# Patient Record
Sex: Female | Born: 1945 | Race: White | Hispanic: No | Marital: Married | Smoking: Never smoker
Health system: Southern US, Community
[De-identification: ages and names within clinical notes are randomized; demographics above are authoritative.]

## PROBLEM LIST (undated history)

## (undated) DIAGNOSIS — I10 Essential (primary) hypertension: Secondary | ICD-10-CM

## (undated) DIAGNOSIS — M1990 Unspecified osteoarthritis, unspecified site: Secondary | ICD-10-CM

## (undated) DIAGNOSIS — R600 Localized edema: Secondary | ICD-10-CM

## (undated) HISTORY — PX: TUBAL LIGATION: SHX77

## (undated) HISTORY — PX: APPENDECTOMY: SHX54

## (undated) HISTORY — DX: Unspecified osteoarthritis, unspecified site: M19.90

## (undated) HISTORY — PX: PARATHYROIDECTOMY: SHX19

## (undated) HISTORY — PX: OTHER SURGICAL HISTORY: SHX169

## (undated) HISTORY — PX: RETINAL LASER PROCEDURE: SHX2339

## (undated) HISTORY — PX: EYE SURGERY: SHX253

---

## 2000-08-13 ENCOUNTER — Ambulatory Visit (HOSPITAL_COMMUNITY): Admission: RE | Admit: 2000-08-13 | Discharge: 2000-08-13 | Payer: Self-pay | Admitting: Unknown Physician Specialty

## 2000-08-13 ENCOUNTER — Encounter: Payer: Self-pay | Admitting: Unknown Physician Specialty

## 2002-04-12 ENCOUNTER — Encounter: Payer: Self-pay | Admitting: Emergency Medicine

## 2002-04-12 ENCOUNTER — Emergency Department (HOSPITAL_COMMUNITY): Admission: EM | Admit: 2002-04-12 | Discharge: 2002-04-12 | Payer: Self-pay | Admitting: Emergency Medicine

## 2003-07-22 ENCOUNTER — Ambulatory Visit (HOSPITAL_COMMUNITY): Admission: RE | Admit: 2003-07-22 | Discharge: 2003-07-22 | Payer: Self-pay | Admitting: Family Medicine

## 2005-05-09 ENCOUNTER — Ambulatory Visit (HOSPITAL_COMMUNITY): Admission: RE | Admit: 2005-05-09 | Discharge: 2005-05-09 | Payer: Self-pay | Admitting: Internal Medicine

## 2006-08-27 ENCOUNTER — Other Ambulatory Visit: Admission: RE | Admit: 2006-08-27 | Discharge: 2006-08-27 | Payer: Self-pay | Admitting: Obstetrics and Gynecology

## 2007-08-06 ENCOUNTER — Ambulatory Visit (HOSPITAL_BASED_OUTPATIENT_CLINIC_OR_DEPARTMENT_OTHER): Admission: RE | Admit: 2007-08-06 | Discharge: 2007-08-06 | Payer: Self-pay | Admitting: Family Medicine

## 2007-08-10 ENCOUNTER — Ambulatory Visit: Payer: Self-pay | Admitting: Internal Medicine

## 2007-08-28 ENCOUNTER — Other Ambulatory Visit: Admission: RE | Admit: 2007-08-28 | Discharge: 2007-08-28 | Payer: Self-pay | Admitting: Obstetrics and Gynecology

## 2008-07-05 ENCOUNTER — Encounter: Admission: RE | Admit: 2008-07-05 | Discharge: 2008-07-05 | Payer: Self-pay | Admitting: Emergency Medicine

## 2008-12-23 ENCOUNTER — Encounter (HOSPITAL_COMMUNITY): Admission: RE | Admit: 2008-12-23 | Discharge: 2008-12-23 | Payer: Self-pay | Admitting: Emergency Medicine

## 2009-02-13 ENCOUNTER — Encounter: Admission: RE | Admit: 2009-02-13 | Discharge: 2009-02-13 | Payer: Self-pay | Admitting: Emergency Medicine

## 2010-04-14 IMAGING — CT CT ABDOMEN W/ CM
2 of 5 series · 17 of 46 positions shown, 19 images · IV contrast (CONTRAST)
Comparison: None

CT ABDOMEN

BUN and creatinine were obtained on site.  Results:  BUN 17
Creatinine
CLINICAL DATA: Left lower quadrant pain.  Fever.  Diverticulitis.

CT ABDOMEN AND PELVIS WITH CONTRAST
TECHNIQUE: Multidetector CT imaging of the abdomen and pelvis was
performed using the standard protocol following bolus
administration of intravenous contrast.
Contrast: 100 ml Fmnipaque-YKK and oral contrast

[Series 2: portal · axial · portal-venous · 0.93mm/px · z∈[+337,+742]mm · 14 of 93 slices shown, 16 images]
[im 6/93  soft-tissue]
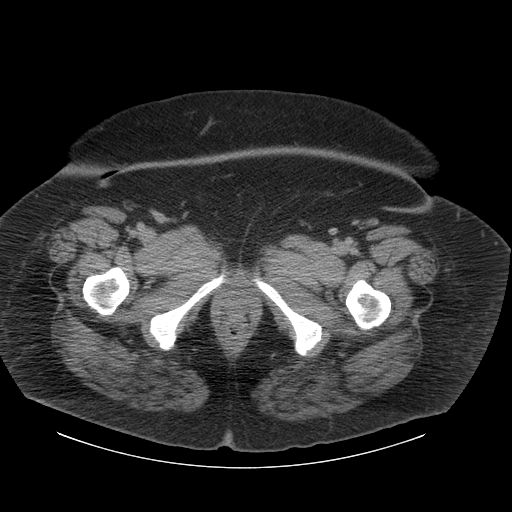
[im 6/93  bone]
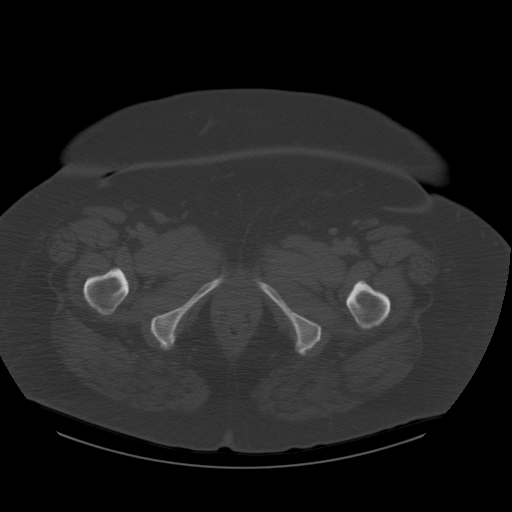
[im 11/93  soft-tissue]
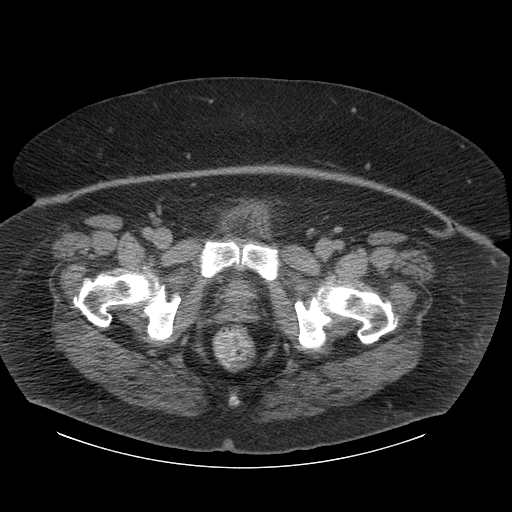
[im 21/93  soft-tissue]
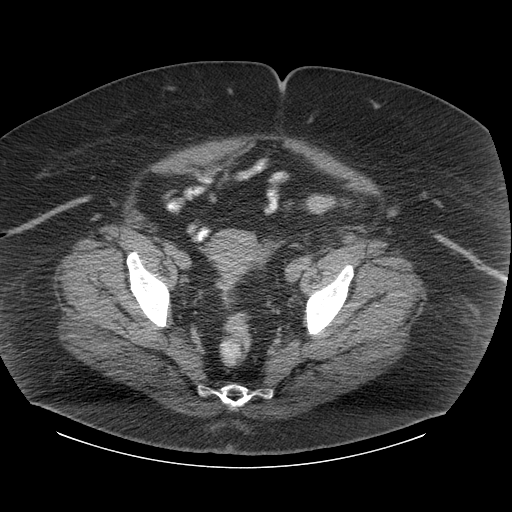
[im 26/93  soft-tissue]
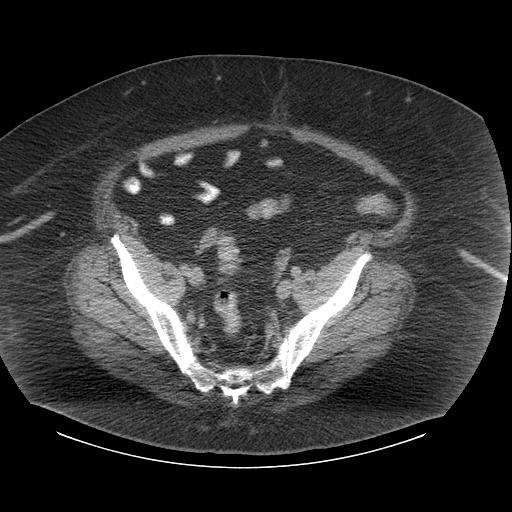
[im 31/93  soft-tissue]
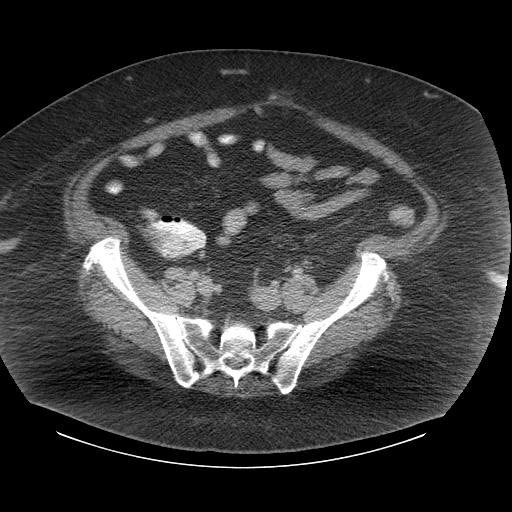
[im 36/93  soft-tissue]
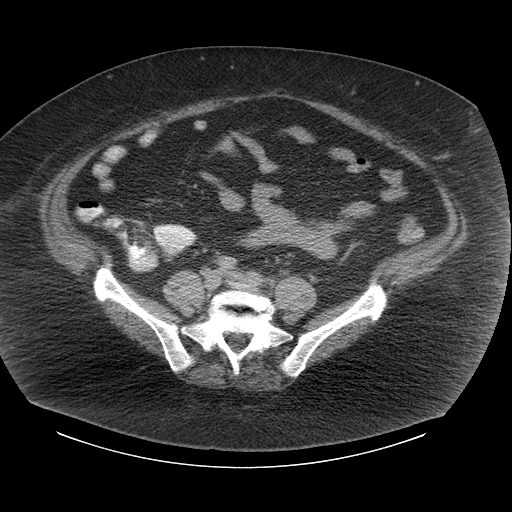
[im 41/93  soft-tissue]
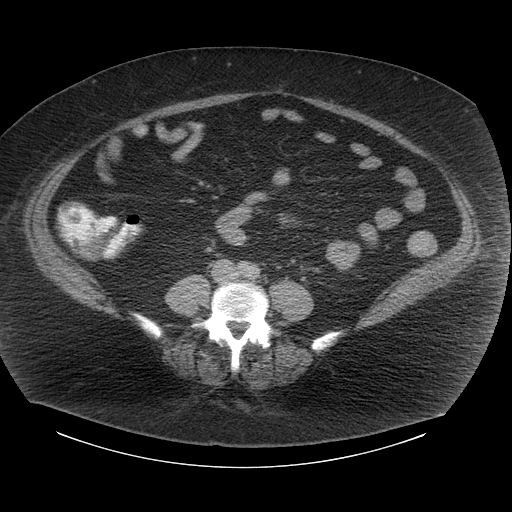
[im 52/93  soft-tissue]
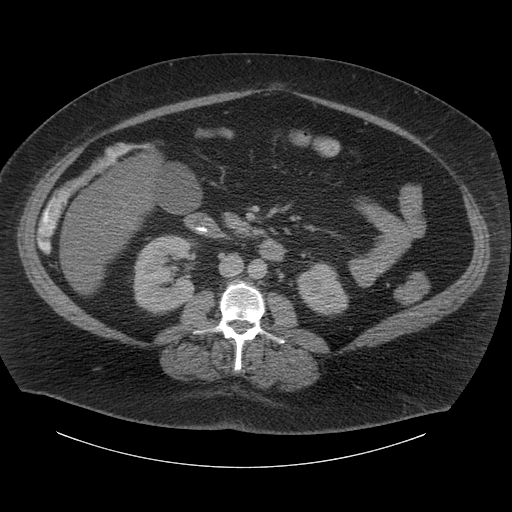
[im 57/93  soft-tissue]
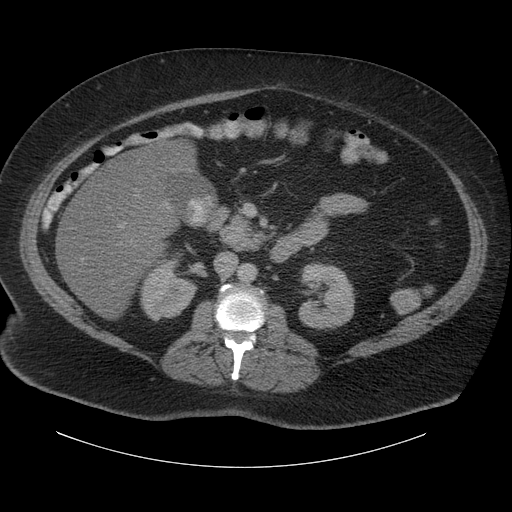
[im 57/93  bone]
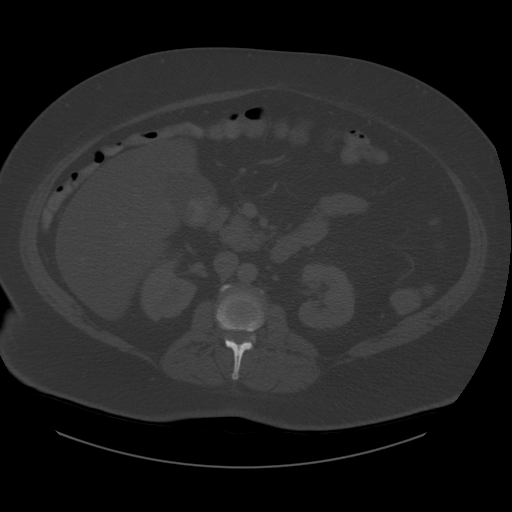
[im 62/93  soft-tissue]
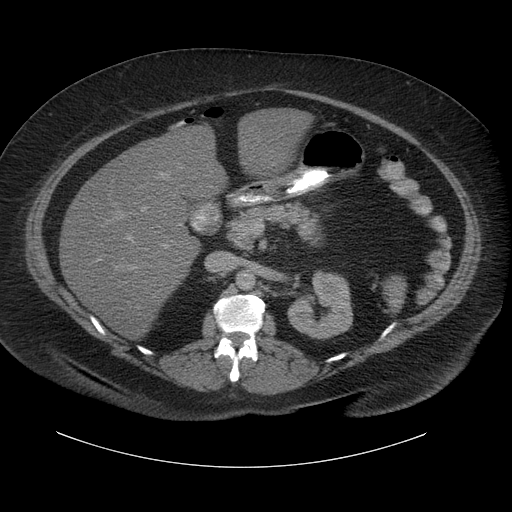
[im 67/93  soft-tissue]
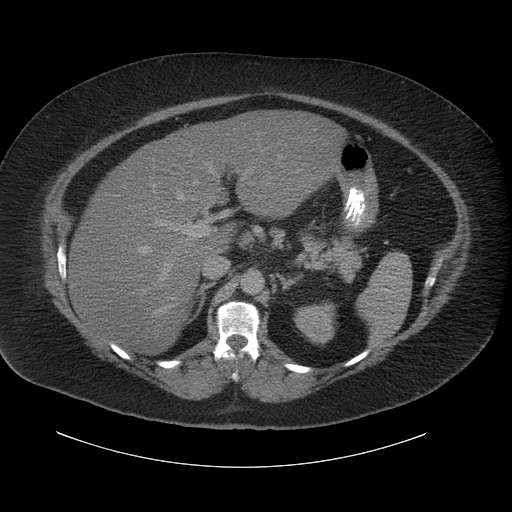
[im 72/93  soft-tissue]
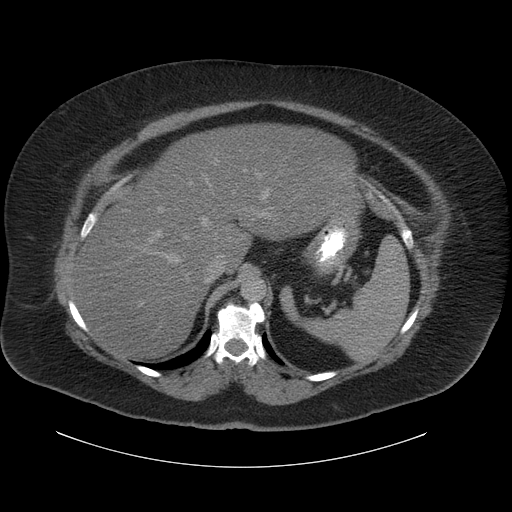
[im 82/93  soft-tissue]
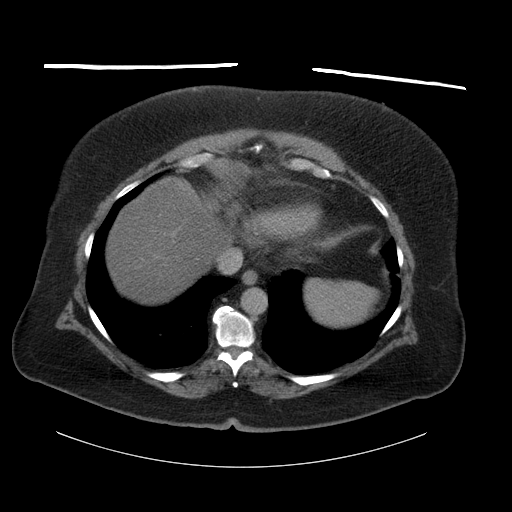
[im 87/93  soft-tissue]
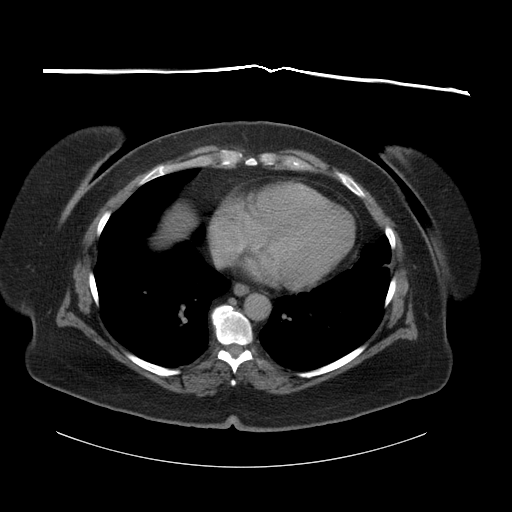

[coronals · coronal · 0.93mm/px · 3 of 95 slices shown]
[im 32/95  soft-tissue]
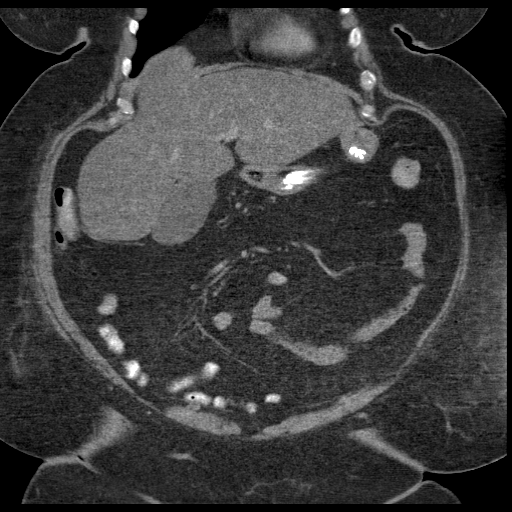
[im 42/95  soft-tissue]
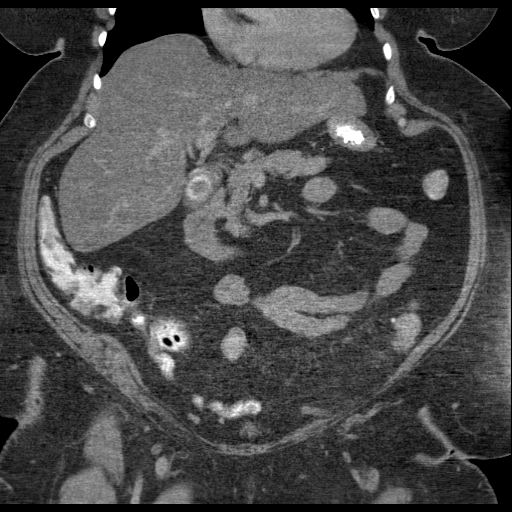
[im 53/95  soft-tissue]
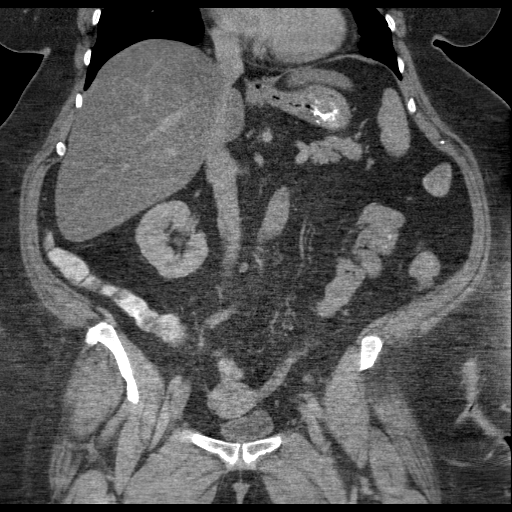

[17 of 46 positions shown; findings below may reference images not displayed]

FINDINGS: Diffuse fatty infiltration of the liver is demonstrated.
No liver masses are identified.  Multiple calcified gallstones are
seen however there is no evidence of acute cholecystitis or biliary
dilatation.

The spleen, pancreas, adrenal glands, and kidneys are normal in
appearance.  There is no evidence of soft tissue masses or
lymphadenopathy.  There is no evidence of inflammatory process or
abnormal fluid collections.
IMPRESSION: 1.  No acute intra-abdominal findings.
2.  Diffuse fatty infiltration of the liver.
3.  Cholelithiasis.

CT PELVIS
FINDINGS: Probable mild diverticulosis seen in the proximal
sigmoid colon.  However, there is no evidence of colonic wall
thickening or pericolonic inflammatory changes to suggest the
presence of acute diverticulitis.  No other inflammatory process
identified in the pelvis.

There is no evidence of pelvic soft tissue masses or
lymphadenopathy.  Uterus and adnexal regions are unremarkable
appearance.  No abnormal fluid collections are seen in the pelvis.
There is no evidence of dilated bowel loops.
IMPRESSION: Probable mild proximal sigmoid diverticulosis.  No evidence of
diverticulitis or other acute findings.

## 2010-06-27 NOTE — Procedures (Signed)
NAME:  Carol Pena, Carol Pena            ACCOUNT NO.:  [id number]   MEDICAL RECORD NO.:  [id number]          PATIENT TYPE:  OUT   LOCATION:  SLEEP CENTER                 FACILITY:  [facility]   PHYSICIAN:  Clinton D. Maple [place], MD, FCCP, FACPDATE OF BIRTH:  January 10[birth year] 1948   DATE OF STUDY:  08/06/2007                            NOCTURNAL POLYSOMNOGRAM   REFERRING PHYSICIAN:  Windle Guard, M.D.   REFERRING PHYSICIAN:  Windle Guard, M.D.   INDICATION FOR STUDY:  Hypersomnia with sleep apnea.   EPWORTH SLEEPINESS SCORE:  11/24.  BMI 51.4.  Weight 290 pounds.  Neck  17 inches.   HOME MEDICATION:  Charted and reviewed.   SLEEP ARCHITECTURE:  Split study protocol.  During the diagnostic phase  total sleep time was 122 minutes with sleep efficiency 55.6%.  Stage 1  was 29.5%.  Stage 2 70.5%.  Stage 3 and REM were absent.  Sleep latency  74.5 minutes.  Awake after sleep onset 22.5 minutes.  Arousal index  26.1.  Two Tylenol were taken at 2130 hours.   RESPIRATORY DATA:  Split study protocol.  Apnea hypopnea index (AHI) 29  per hour indicating moderate sleep apnea syndrome.  Fifty-nine events  were scored including 3 obstructive apneas and 56 hypopneas.  Events  were recorded as nonsupine.  CPAP was titrated to 10 CWP, AHI 3.9 per  hour.  She chose a petite Respironics comfort gel nasal mask with heated  humidifier.   OXYGEN DATA:  Moderate snoring before CPAP with oxygen desaturation to a  nadir of 87%.  After CPAP control mean oxygen saturation held 93.3% on  room air.   CARDIAC DATA:  Normal sinus rhythm.   MOVEMENT-PARASOMNIA:  No significant movement disturbance.  Bathroom x1.   IMPRESSIONS-RECOMMENDATIONS:  1. Moderate obstructive sleep apnea/hypopnea syndrome, AHI 29 per hour      with most events recorded while nonsupine.  Mild to moderate      snoring with oxygen desaturation to a nadir of 87%.  2. Successful CPAP titration to 10 CWP, AHI 3.9 per hour.  She chose a      petite Respironics  comfort gel nasal mask with heated humidifier.      Clinton D. Maple [place], MD, [facility], FACP  Diplomate, Biomedical engineer of Sleep Medicine  Electronically Signed     CDY/MEDQ  D:  08/09/2007 10:05:36  T:  08/09/2007 10:36:10  Job:  [id number]

## 2011-01-25 ENCOUNTER — Ambulatory Visit (HOSPITAL_COMMUNITY)
Admission: RE | Admit: 2011-01-25 | Discharge: 2011-01-25 | Disposition: A | Discharge status: Home / Self Care | Payer: 59 | Source: Ambulatory Visit | Attending: Gastroenterology | Admitting: Gastroenterology

## 2011-01-25 ENCOUNTER — Encounter (HOSPITAL_COMMUNITY): Payer: Self-pay | Admitting: *Deleted

## 2011-01-25 ENCOUNTER — Other Ambulatory Visit: Payer: Self-pay | Admitting: Gastroenterology

## 2011-01-25 ENCOUNTER — Encounter (HOSPITAL_COMMUNITY)
Admission: RE | Disposition: A | Discharge status: Home / Self Care | Payer: Self-pay | Source: Ambulatory Visit | Attending: Gastroenterology

## 2011-01-25 DIAGNOSIS — E119 Type 2 diabetes mellitus without complications: Secondary | ICD-10-CM | POA: Insufficient documentation

## 2011-01-25 DIAGNOSIS — D126 Benign neoplasm of colon, unspecified: Secondary | ICD-10-CM | POA: Insufficient documentation

## 2011-01-25 DIAGNOSIS — K5730 Diverticulosis of large intestine without perforation or abscess without bleeding: Secondary | ICD-10-CM | POA: Insufficient documentation

## 2011-01-25 DIAGNOSIS — K639 Disease of intestine, unspecified: Secondary | ICD-10-CM | POA: Insufficient documentation

## 2011-01-25 DIAGNOSIS — Z1211 Encounter for screening for malignant neoplasm of colon: Secondary | ICD-10-CM | POA: Insufficient documentation

## 2011-01-25 DIAGNOSIS — I10 Essential (primary) hypertension: Secondary | ICD-10-CM | POA: Insufficient documentation

## 2011-01-25 HISTORY — PX: COLONOSCOPY: SHX5424

## 2011-01-25 HISTORY — DX: Essential (primary) hypertension: I10

## 2011-01-25 HISTORY — DX: Localized edema: R60.0

## 2011-01-25 SURGERY — COLONOSCOPY
Anesthesia: Moderate Sedation

## 2011-01-25 MED ORDER — SODIUM CHLORIDE 0.9 % IV SOLN
INTRAVENOUS | Status: DC
  Administered 2011-01-25: 08:00:00 via INTRAVENOUS

## 2011-01-25 MED ORDER — FENTANYL CITRATE 0.05 MG/ML IJ SOLN
INTRAMUSCULAR | Status: AC
  Filled 2011-01-25: qty 2

## 2011-01-25 MED ORDER — FENTANYL CITRATE 0.05 MG/ML IJ SOLN
INTRAMUSCULAR | Status: DC | PRN
  Administered 2011-01-25 (×3): 25 ug via INTRAVENOUS

## 2011-01-25 MED ORDER — MIDAZOLAM HCL 10 MG/2ML IJ SOLN
INTRAMUSCULAR | Status: AC
  Filled 2011-01-25: qty 2

## 2011-01-25 MED ORDER — MIDAZOLAM HCL 5 MG/5ML IJ SOLN
INTRAMUSCULAR | Status: DC | PRN
  Administered 2011-01-25 (×3): 2 mg via INTRAVENOUS

## 2011-01-25 NOTE — Op Note (Signed)
[facility] [address] [place], [place]  [address]  OPERATIVE PROCEDURE REPORT  PATIENT:  Carol Pena, Carol Pena  MR#:  [id number] BIRTHDATE:  Jun 29, 1945  GENDER:  female ENDOSCOPIST:  Dr.Jyothi Anastasio Auerbach ASSISTANT:  Anthony Sar, RN and Kandice Robinsons, technician.  PROCEDURE DATE:  01/25/2011 PRE-PROCEDURE PREPERATION:  Moviprep was taken as instructed (32 ounces the night prior to the procedure and 32 ounces the morning of the procedure).  The patient was fasted for four hours prior to the procedure. PRE-PROCEDURE PHYSICAL:  Patient has stable vital signs. Neck is supple. There is no JVD, thyromegaly or LAD.Chest clear to auscultation. S1 and S2 regular. Abdomen soft, morbidly obese, non-distended, non-tender with NABS. PROCEDURE:  Colonoscopy with multiple cold biopsies and hot snare polypectomy x 2. ASA CLASS:  Class III INDICATIONS:  1) Colorectal cancer screening, average risk MEDICATIONS:  Fentanyl 75 mcg & Versed 6 mg IV.  DESCRIPTION OF PROCEDURE: After the risks, benefits, and alternatives of the procedure were thoroughly explained [including a 10% missed rate of cancer and polyps], informed consent was obtained.  Digital rectal exam was performed.  The Pentax video colonoscope [id number] was introduced through the anus and advanced to the terminal ileum which was intubated for a short distance, without limitations. The quality of the prep was good, using MoviPrep. Multiple washes were done. Small lesions could be missed. The instrument was then slowly withdrawn as the colon was fully examined. <<PROCEDUREIMAGES>>  FINDINGS:  There were scattered sigmoid divericula noted. One small sessile polyp was remved by cold biopsy from the rectosigmoid colon and one 5-6 mm sessile polyp was removed by a hot snare x 1 [150/15] from 20 cm as well. One larger polyp measuring 7-8 mm was removed from the mid-transverse colon but this was lost in stool. Nodlar mucosa was  biopsied at the cecl base to rule out carcinoidof the appendix. The rest of the colonic mucosa appeared healthy with a normal vascular pattern. No masses or AVM's were noted. The appendiceal orifice and the ICV were identified and photographed. The terminal ileum appeared normal. Retroflexed views revealed no abnormalities. The patient tolerated the procedure without immediate complications. The scope was then withdrawn from the patient and the procedure terminated.  IMPRESSION:  1) Scattered sigmoid diverticulosis. 2) Three polyps removed [see description above]. 3) Nodular changes in the cecal base-biopsied for pathology. 4) Normal terminal ileum.  RECOMMENDATIONS:  1) Await pathology results 2) Avoid all NSAIDS for the next 2 weeks. 3) Continue Surveillance 4) OP follow-up is advised on a PRN basis.  REPEAT EXAM:  In 5 years; in case the patient has any abnormal GI symptoms in the interim, she should contact the office immediately for further recommendations.  DISCHARGE INSTRUCTIONS:  Standard discharge instructions given.  ______________________________ Benjamine Mola  CPT CODES: 34742-59  DIAGNOSIS CODES:  V76.51, 562.10, 211.3  CC:  Loma Newton, MD Windle Guard, MD  n. Rosalie Doctor:   Dr.Jyothi Anastasio Auerbach at 01/25/2011 09:12 AM  [place]ray, Clerance Lav, [phone]

## 2011-01-25 NOTE — Progress Notes (Signed)
Subjective: Since I last evaluated the patient, she has had no changes in her medical history. She is here today for a screening colonoscopy.  Objective: Vital signs in last 24 hours: Temp:  [98.1 F (36.7 C)] 98.1 F (36.7 C) (12/13 0722) Resp:  [12-20] 12  (12/13 0744) BP: (129-149)/(74-119) 149/119 mmHg (12/13 0744) SpO2:  [96 %-100 %] 100 % (12/13 0744)    IGeneral appearance: alert, cooperative, appears stated age and morbidly obese Resp: clear to auscultation bilaterally Cardio: regular rate and rhythm, S1, S2 normal, no murmur, click, rub or gallop GI: soft, non-tender; bowel sounds normal; no masses,  no organomegaly  Lab Results: No results found for this basename: WBC:3,HGB:3,HCT:3,PLT:3 in the last 72 hours BMET No results found for this basename: NA:3,K:3,CL:3,CO2:3,GLUCOSE:3,BUN:3,CREATININE:3,CALCIUM:3 in the last 72 hours LFT No results found for this basename: PROT,ALBUMIN,AST,ALT,ALKPHOS,BILITOT,BILIDIR,IBILI in the last 72 hours PT/INR No results found for this basename: LABPROT:2,INR:2 in the last 72 hours Hepatitis Panel No results found for this basename: HEPBSAG,HCVAB,HEPAIGM,HEPBIGM in the last 72 hours C-Diff No results found for this basename: CDIFFTOX:3 in the last 72 hours Fecal Lactopherrin No results found for this basename: FECLLACTOFRN in the last 72 hours  Studies/Results: No results found.  Medications: I have reviewed the patient's current medications.  Assessment/Plan: Colorectal cancer screening: Proceed with a colonoscopy at this time.  Hodge Stachnik 01/25/2011, 7:44 AM

## 2011-01-26 ENCOUNTER — Encounter (HOSPITAL_COMMUNITY): Payer: Self-pay

## 2011-01-26 ENCOUNTER — Encounter (HOSPITAL_COMMUNITY): Payer: Self-pay | Admitting: Gastroenterology

## 2011-08-02 ENCOUNTER — Ambulatory Visit (INDEPENDENT_AMBULATORY_CARE_PROVIDER_SITE_OTHER): Payer: BC Managed Care – PPO | Admitting: Family Medicine

## 2011-08-02 ENCOUNTER — Ambulatory Visit: Payer: BC Managed Care – PPO

## 2011-08-02 VITALS — BP 168/84 | HR 80 | Temp 98.0°F | Resp 18 | Ht 63.0 in

## 2011-08-02 DIAGNOSIS — M79671 Pain in right foot: Secondary | ICD-10-CM

## 2011-08-02 DIAGNOSIS — M79609 Pain in unspecified limb: Secondary | ICD-10-CM

## 2011-08-02 NOTE — Patient Instructions (Addendum)
Patient information: Metatarsalgia (The Basics)View in SpanishWritten by the doctors and editors at UpToDate  What is metatarsalgia? -- Metatarsalgia is a condition that causes pain in the ball of the foot. It happens when there is inflammation in the metatarsals, which are the foot bones closest to the toes (figure 1). Different things can cause metatarsalgia. It can happen in people who run or do other activities that put a lot of pressure on the feet. It can also happen in people who wear tight-fitting shoes a lot or have certain foot problems. What are the symptoms of metatarsalgia? -- Metatarsalgia causes pain in the ball of the foot. The pain can also spread to the toes. The pain is usually worse when people run or do other activities that put pressure on their feet. Will I need tests? -- Probably not. Your doctor or nurse should be able to tell if you have metatarsalgia by learning about your symptoms and doing an exam. He or she might order an X-ray of your foot to make sure your symptoms are not caused by another condition. How is metatarsalgia treated? -- Treatment for metatarsalgia usually involves 1 or more of the following: Resting your foot - Give your foot a chance to heal by resting. But don't completely stop being active. You can do activities that put less pressure on your feet, such as swimming.  Putting ice on your foot when it hurts or after activities that cause pain - You can put a cold gel pack, bag of ice, or bag of frozen vegetables on the painful area every 1 to 2 hours, for 15 minutes each time. Put a thin towel between the ice (or other cold object) and your skin.  Taking a pain-relieving medicine, such as acetaminophen (sample brand name: Tylenol), ibuprofen (sample brand names: Advil, Motrin), or naproxen (sample brand names: Aleve, Naprosyn).  Wearing sturdy shoes and a metatarsal insert - Sneakers with a lot of cushion and good arch and heel support are best. Your  doctor will also probably recommend that you put a padded insert called a "metatarsal pad" into your shoe.  Wearing arch supports or special shoe inserts called "orthotics" that are made to fit your foot In most cases, these treatments help improve symptoms. But if your symptoms don't improve, your doctor might talk with you about other options. This might involve surgery to correct the position of your foot bones. How long does metatarsalgia take to heal? -- Metatarsalgia can take weeks to months to heal, depending on the cause and your symptoms. More on this topic

## 2011-08-02 NOTE — Progress Notes (Signed)
  Subjective:    Patient ID: Carol Pena, female    DOB: [birth year], 66 y.o.   MRN: [id number]  HPI R foot pain x 1 day.  Pt was house hunting with son and felt pop in R foot.  Has had pain in R foot since this point.  Difficulty bearing weight on R foot.  No noted swelling or paresthesia.  No direct trauma per pt.  Pain is on plantar and dorsal aspect of R foot.    Review of Systems See HPI, otherwise ROS negative     Objective:   Physical Exam Gen: in bed, NAD  CV: RRR PULM: CTAB Ankle: No visible erythema or swelling. Range of motion is full in all directions. Strength is 5/5 in all directions. Stable lateral and medial ligaments; squeeze test and kleiger test unremarkable; Talar dome nontender; + TTP over 1st metatarsal on plantar and dorsal aspect.  + pain with plantar and dorsiflexion of 1st metatarsal.  Negative tarsal tunnel tinel's Unable to bear weight on anterior ball of R foot.     [facility] reading (PRIMARY) by  Dr. Alvester Morin: No noted fracture in 1st metatarsal; Otherwise normal anatomy .      Assessment & Plan:   History and physical most consistent with metatarsalgia.  No noted fracture on xray.  Post op shoe.  Metatarsal pad.  RICE treatment.  Avoid NSAIDs in setting of HTN.  Follow up with [facility] or PCP in 1 week.  General red flags reviewed.  Handout given.     The patient and/or caregiver has been counseled thoroughly with regard to treatment plan and/or medications prescribed including dosage, schedule, interactions, rationale for use, and possible side effects and they verbalize understanding. Diagnoses and expected course of recovery discussed and will return if not improved as expected or if the condition worsens. Patient and/or caregiver verbalized understanding.

## 2011-08-06 ENCOUNTER — Telehealth: Payer: Self-pay

## 2011-08-06 NOTE — Telephone Encounter (Signed)
COPY OF PT'S XRAY AT FRONT FOR P/U.

## 2011-08-06 NOTE — Telephone Encounter (Signed)
PATIENT NEEDS TO PICK UP A COPY OF HER X-RAYS SHE HAD DONE ON HER FOOT LAST WEEK.  SHE NEEDS TO GET THIS TODAY BECAUSE SHE HAS ANOTHER APPOINTMENT TOMORROW.

## 2013-08-03 ENCOUNTER — Ambulatory Visit: Payer: BC Managed Care – PPO | Admitting: Podiatrist

## 2013-08-05 ENCOUNTER — Ambulatory Visit (INDEPENDENT_AMBULATORY_CARE_PROVIDER_SITE_OTHER): Payer: BC Managed Care – PPO

## 2013-08-05 ENCOUNTER — Other Ambulatory Visit: Payer: Self-pay | Admitting: Podiatrist

## 2013-08-05 ENCOUNTER — Encounter: Payer: Self-pay | Admitting: Podiatrist

## 2013-08-05 ENCOUNTER — Ambulatory Visit (INDEPENDENT_AMBULATORY_CARE_PROVIDER_SITE_OTHER): Payer: BC Managed Care – PPO | Admitting: Podiatrist

## 2013-08-05 VITALS — BP 126/69 | HR 100 | Resp 16

## 2013-08-05 DIAGNOSIS — M79609 Pain in unspecified limb: Secondary | ICD-10-CM

## 2013-08-05 DIAGNOSIS — M79671 Pain in right foot: Secondary | ICD-10-CM

## 2013-08-05 DIAGNOSIS — M8430XA Stress fracture, unspecified site, initial encounter for fracture: Secondary | ICD-10-CM

## 2013-08-05 DIAGNOSIS — M84375A Stress fracture, left foot, initial encounter for fracture: Secondary | ICD-10-CM

## 2013-08-05 DIAGNOSIS — M779 Enthesopathy, unspecified: Secondary | ICD-10-CM

## 2013-08-05 NOTE — Progress Notes (Signed)
  Subjective:  Patient presents for pain on the lateral aspect of her left foot.  She relates she has had gout in the past and wonders if it could be from a gout flare.  Relates she is on allopurionl and her recent uric acid level was in the 9 range.    Objective:  Patient is awake, alert, and oriented x 3.  In no acute distress.  Vascular status is intact with palpable pedal pulses at 2/4 DP and PT bilateral and capillary refill time within normal limits. Neurological sensation is also intact bilaterally via Semmes Weinstein monofilament at 3/5 sites. Light touch, vibratory sensation, Achilles tendon reflex is intact. Dermatological exam reveals skin color, turger and texture as normal. No open lesions present.  Musculature intact with dorsiflexion, plantarflexion, inversion, eversion. Supinated foot type is noted.  Pain along the lateral foot and 4th metatarsal base is noted clinically.  xrays show probably stress fracture in the 4th metatarsal near the base.  Assessment:  Stress fracture left foot  Plan;  Discussed xray findings and recommended a short air fracture walker.  Discussed gout and how it usually presents and discussed this is likely not a gout attack.  Will consider inserts in the future to support her supinated foot type.

## 2013-08-05 NOTE — Patient Instructions (Addendum)
DIABETIC SHOES-  Diabetic shoes and accomidative inserts are indicated for those persons who have Diabetes and who are at an increased for a foot ulceration.  This requires patients to have decreased feeling in the feet, circulation problems, as well as an abnormal foot structure, or a combination of these disorders.  As podiatrists, we can recommend a diabetic shoe to your primary care doctor or endocrinologist HOWEVER, it is up to them to decide if you are eligible or in need of these shoes.  Before we can take measurements or do the ordering of a diabetic shoe and accomidative insert, it is required that we get a signed authorization from your physician both confirming you have Diabetes and that you are at risk for an ulceration.  Please note, if you physician will not sign for the shoes, there is no way we can go around him or her.  You are welcome to place an order for the shoes, however the financial responsibility is up to you and your insurance cannot be billed.   Once, approval is granted for the diabetic shoes and inserts we will make you a separate appointment to be measured, casted, and for you to choose the diabetic shoe.  We will then call you when they are ready for dispensing and you will be seen to make sure the shoes and inserts fit your foot properly.  As this is a multi-step process, it generally takes 4-8 weeks from start to finish.  You cooperation is appreciated.  If it has been more than 6 weeks from the date you ordered your diabetic shoe and you have not heard from our office, please give [place] a call.      Metatarsal Stress Fracture When too much stress is put on the foot, as in running and jumping sports, the center shaft of the bones of the forefoot is very susceptible to stress fractures (break in bone). This is because of repetitive stress on the bone. This injury is more common if osteoporosis is present or if inadequate running shoes are used. Rapid increase in running  distances are often the cause. Running distances should be gradually increased to avoid this problem. Shoes should be used which adequately cushion the foot. Shoes should absorb the shocks of the activity.  DIAGNOSIS  Usually the diagnosis is made by history. The foot progressively becomes sorer with activities. X-rays may be negative (show no break) within the first 2 to 3 weeks of the beginning of pain. A later X-ray may show signs of healing bone (callus formation). A bone scan or MRI will usually make the diagnosis earlier. TREATMENT AND HOME CARE INSTRUCTIONS  Treatment may or may not include a cast, removable fracture boot, or walking shoe. Casts are used for short periods of time to prevent muscle atrophy (muscle wasting).  Activities should be stopped until further advised by your caregiver.  Wear shoes with adequate shock absorbing abilities.  Alternative exercise may be undertaken while waiting for healing. These may include bicycling and swimming, or as your caregiver suggests. If you do not have a cast or splint:  You may walk on your injured foot as tolerated or advised.  Do not put any weight on your injured foot for as long as directed by your caregiver. Slowly increase the amount of time you walk on the foot as the pain allows or as advised.  Use crutches until you can bear weight without pain. A gradual increase in weight bearing may help.  Apply ice  to the injury for 15-20 minutes each hour while awake for the first 2 days. Put the ice in a plastic bag and place a towel between the bag of ice and your skin.  Only take over-the-counter or prescription medicines for pain, discomfort, or fever as directed by your caregiver. SEEK IMMEDIATE MEDICAL CARE IF:   Pain is becoming worse rather than better, or if pain is uncontrolled with medications.  You have increased swelling or redness in the foot. MAKE SURE YOU:   Understand these instructions.  Will watch your  condition.  Will get help right away if you are not doing well or get worse. Document Released: 01/27/2000 Document Revised: 04/23/2011 Document Reviewed: 11/25/2007 [facility] Patient Information 2015 [place], [place]. This information is not intended to replace advice given to you by your health care provider. Make sure you discuss any questions you have with your health care provider.

## 2013-09-23 ENCOUNTER — Other Ambulatory Visit: Payer: Self-pay | Admitting: Dermatology

## 2013-09-29 ENCOUNTER — Ambulatory Visit (INDEPENDENT_AMBULATORY_CARE_PROVIDER_SITE_OTHER): Payer: BC Managed Care – PPO | Admitting: Certified Nurse Midwife

## 2013-09-29 ENCOUNTER — Encounter: Payer: Self-pay | Admitting: Certified Nurse Midwife

## 2013-09-29 VITALS — BP 120/64 | HR 68 | Resp 16 | Ht 62.75 in | Wt 269.0 lb

## 2013-09-29 DIAGNOSIS — Z01419 Encounter for gynecological examination (general) (routine) without abnormal findings: Secondary | ICD-10-CM

## 2013-09-29 DIAGNOSIS — Z124 Encounter for screening for malignant neoplasm of cervix: Secondary | ICD-10-CM

## 2013-09-29 DIAGNOSIS — Z0000 Encounter for general adult medical examination without abnormal findings: Secondary | ICD-10-CM

## 2013-09-29 DIAGNOSIS — I10 Essential (primary) hypertension: Secondary | ICD-10-CM

## 2013-09-29 DIAGNOSIS — E119 Type 2 diabetes mellitus without complications: Secondary | ICD-10-CM | POA: Insufficient documentation

## 2013-09-29 LAB — POCT URINALYSIS DIPSTICK
BILIRUBIN UA: NEGATIVE
GLUCOSE UA: NEGATIVE
Ketones, UA: NEGATIVE
LEUKOCYTES UA: NEGATIVE
NITRITE UA: NEGATIVE
Protein, UA: NEGATIVE
RBC UA: NEGATIVE
UROBILINOGEN UA: NEGATIVE
pH, UA: 5

## 2013-09-29 NOTE — Patient Instructions (Signed)

## 2013-09-29 NOTE — Progress Notes (Signed)
68 y.o. G45P2002 Married Caucasian Fe here for annual exam.Menopausal no HRT. Patient denies any vaginal dryness or vaginal bleeding. Patient being followed with Dr. Arelia Sneddon with Diabetes, hypertension,and gout management. Recent death of father and now mother with dementia. Patient is still dealing with this grief and does have support from friends, family and church.  Patient has been working on weight loss and now has lost another 30 pounds! No other health issues today.  Patient's last menstrual period was 02/12/2001.          Sexually active: Yes.    The current method of family planning is tubal ligation.    Exercising: No.  exercise Smoker:  no  Health Maintenance: Pap:  09-27-09 neg MMG: 09-14-13 composition category b, birads category 1:neg Colonoscopy:  Age 53 normal BMD:   2008  normal TDaP: up to date Labs: Poct urine-neg Self breast exam: done occ   reports that she has never smoked. She does not have any smokeless tobacco history on file. She reports that she does not drink alcohol or use illicit drugs.  Past Medical History  Diagnosis Date  . Hypertension   . Diabetes mellitus   . Cataract     left eye catract  . Gout   . Diverticula of colon   . Cholelithiasis   . Edema of both legs     Past Surgical History  Procedure Laterality Date  . Parathyroidectomy    . Retinal laser procedure    . Eye surgery    . C-section x2    . Catract left eye    . Appendectomy    . Colonoscopy  01/25/2011    Procedure: COLONOSCOPY;  Surgeon: Juanita Craver, MD;  Location: [facility];  Service: Endoscopy;  Laterality: N/A;  . Tubal ligation      Current Outpatient Prescriptions  Medication Sig Dispense Refill  . ALLOPURINOL PO Take 300 mg by mouth daily.       Marland Kitchen lisinopril (PRINIVIL,ZESTRIL) 10 MG tablet Take 10 mg by mouth daily.        . metFORMIN (GLUCOPHAGE) 1000 MG tablet Take 1,000 mg by mouth 2 (two) times daily with a meal.      . multivitamin (THERAGRAN) per tablet Take  1 tablet by mouth daily.        . fluticasone (CUTIVATE) 0.005 % ointment        No current facility-administered medications for this visit.    Family History  Problem Relation Age of Onset  . Osteoporosis Mother   . Heart disease Mother   . Hypertension Mother   . Heart attack Mother     ROS:  Pertinent items are noted in HPI.  Otherwise, a comprehensive ROS was negative.  Exam:   BP 120/64  Pulse 68  Resp 16  Ht 5' 2.75" (1.594 m)  Wt 269 lb (122.018 kg)  BMI 48.02 kg/m2  LMP 02/12/2001 Height: 5' 2.75" (159.4 cm)  Ht Readings from Last 3 Encounters:  09/29/13 5' 2.75" (1.594 m)  08/02/11 5\' 3"  (1.6 m)  01/25/11 5\' 3"  (1.6 m)    General appearance: alert, cooperative and appears stated age Head: Normocephalic, without obvious abnormality, atraumatic Neck: no adenopathy, supple, symmetrical, trachea midline and thyroid normal to inspection and palpation Lungs: clear to auscultation bilaterally Breasts: normal appearance, no masses or tenderness, No nipple retraction or dimpling, No nipple discharge or bleeding, No axillary or supraclavicular adenopathy Heart: regular rate and rhythm Abdomen: soft, non-tender; no masses,  no organomegaly  Extremities: extremities normal, atraumatic, no cyanosis or edema Skin: Skin color, texture, turgor normal. No rashes or lesions Lymph nodes: Cervical, supraclavicular, and axillary nodes normal. No abnormal inguinal nodes palpated Neurologic: Grossly normal   Pelvic: External genitalia:  no lesions              Urethra:  normal appearing urethra with no masses, tenderness or lesions              Bartholin's and Skene's: normal                 Vagina: normal appearing vagina with normal color and discharge, no lesions              Cervix: normal, very posterior, non tender, no lesions              Pap taken: Yes.   Bimanual Exam:  Uterus:  normal size, contour, position, consistency, mobility, non-tender and anteverted limited due  to body habitus              Adnexa: no mass, fullness, tenderness adnexa not palpated due to body habitus, no large masses noted               Rectovaginal: Confirms               Anus:  normal sphincter tone, no lesions  A:  Well Woman with normal exam  Menopausal no HRT  Limited pelvic exam due to morbid obesity  Hypertension/Type 2 Diabetes/Gout under management with PCP    P:   Reviewed health and wellness pertinent to exam  Aware of need to evaluate if vaginal bleeding  Discussed limited exam due to body habitus and PUS recommended, which she has had before due to weigh, but has been over 3 years. Patient agreeable to scheduling. Discussed will be notified by of insurance information and then scheduled.  Pap smear taken today with HPVHR  Continue follow up as indicated  counseled on breast self exam, mammography screening, adequate intake of calcium and vitamin D, diet and exercise, Kegel's exercises, discussed BMD due, patient to schedule.  return annually or prn  An After Visit Summary was printed and given to the patient.

## 2013-09-30 ENCOUNTER — Ambulatory Visit: Payer: BC Managed Care – PPO | Admitting: Certified Nurse Midwife

## 2013-09-30 NOTE — Progress Notes (Signed)
Reviewed personally.  M. Suzanne Jesse Hirst, MD.  

## 2013-10-05 ENCOUNTER — Ambulatory Visit: Payer: BC Managed Care – PPO | Admitting: Certified Nurse Midwife

## 2013-10-05 LAB — IPS PAP TEST WITH HPV

## 2013-10-06 ENCOUNTER — Telehealth: Payer: Self-pay | Admitting: Certified Nurse Midwife

## 2013-10-06 NOTE — Telephone Encounter (Signed)
Spoke with patient. Advised that per benefit quote received, she will be responsible for $15 copay when she comes in for PUS. Patient agreeable. Scheduled PUS. Advised patient of 72 hour cancellation policy and $233 cancellation fee. Patient agreeable.

## 2013-10-20 ENCOUNTER — Ambulatory Visit (INDEPENDENT_AMBULATORY_CARE_PROVIDER_SITE_OTHER): Payer: BC Managed Care – PPO | Admitting: Gynecology

## 2013-10-20 ENCOUNTER — Encounter: Payer: Self-pay | Admitting: Gynecology

## 2013-10-20 ENCOUNTER — Ambulatory Visit (INDEPENDENT_AMBULATORY_CARE_PROVIDER_SITE_OTHER): Payer: BC Managed Care – PPO

## 2013-10-20 VITALS — BP 120/60 | Resp 18 | Ht 62.75 in | Wt 275.0 lb

## 2013-10-20 DIAGNOSIS — E669 Obesity, unspecified: Secondary | ICD-10-CM

## 2013-10-20 NOTE — Progress Notes (Signed)
       Pt here for PUS due to limited pelvic exam secondary to obesity. Images reviewed with pt Normal atrophic appearing uterus, ovaries noted, no free fluid. Pt assured Questions addressed

## 2013-12-14 ENCOUNTER — Encounter: Payer: Self-pay | Admitting: Gynecology

## 2014-10-06 ENCOUNTER — Ambulatory Visit: Payer: BC Managed Care – PPO | Admitting: Certified Nurse Midwife

## 2015-03-27 ENCOUNTER — Ambulatory Visit (INDEPENDENT_AMBULATORY_CARE_PROVIDER_SITE_OTHER): Payer: Medicare Other | Admitting: Family Medicine

## 2015-03-27 VITALS — BP 138/80 | HR 88 | Temp 98.6°F | Resp 16 | Ht 63.0 in | Wt 274.0 lb

## 2015-03-27 DIAGNOSIS — E119 Type 2 diabetes mellitus without complications: Secondary | ICD-10-CM

## 2015-03-27 DIAGNOSIS — J029 Acute pharyngitis, unspecified: Secondary | ICD-10-CM

## 2015-03-27 LAB — POCT RAPID STREP A (OFFICE): RAPID STREP A SCREEN: NEGATIVE

## 2015-03-27 MED ORDER — FLUTICASONE PROPIONATE 50 MCG/ACT NA SUSP: 2.0000 | Freq: Every day | NASAL | Status: DC

## 2015-03-27 MED ORDER — MUCINEX DM 30-600 MG PO TB12: 1.0000 | ORAL_TABLET | Freq: Two times a day (BID) | ORAL | Status: DC

## 2015-03-27 NOTE — Progress Notes (Signed)
Subjective:    Patient ID: Carol Pena, female    DOB: [birth year], 70 y.o.   MRN: [id number]  03/27/2015  Sore Throat; Cough; and Nasal Congestion   HPI This 70 y.o. female presents for evaluation of sore throat. Onset five days ago.  +low grade fever 99.8.  No chills/sweats.  No body aches.  No headache.  +rhinorrhea; +nasal congestion excessive green to brown.  ST diffuse; pain with swallowing.  Tea with lemon.  +coughing mild.  No SOB.  No v/d.  Grandson sick.  Taking Vitamin C and Zinc.  Sugars running 128 yesterday.   Review of Systems  Constitutional: Positive for fever, chills, diaphoresis and fatigue.  HENT: Positive for congestion, postnasal drip, rhinorrhea and sore throat. Negative for ear pain, sinus pressure and trouble swallowing.   Respiratory: Positive for cough. Negative for shortness of breath.   Cardiovascular: Negative for chest pain, palpitations and leg swelling.  Gastrointestinal: Negative for nausea, vomiting, abdominal pain, diarrhea and constipation.    Past Medical History  Diagnosis Date  . Hypertension   . Diabetes mellitus   . Cataract     left eye catract  . Gout   . Diverticula of colon   . Cholelithiasis   . Edema of both legs   . Arthritis    Past Surgical History  Procedure Laterality Date  . Parathyroidectomy    . Retinal laser procedure    . Eye surgery    . C-section x2    . Catract left eye    . Appendectomy    . Colonoscopy  01/25/2011    Procedure: COLONOSCOPY;  Surgeon: Juanita Craver, MD;  Location: [facility];  Service: Endoscopy;  Laterality: N/A;  . Tubal ligation    . Cesarean section     Allergies  Allergen Reactions  . Flagyl [Metronidazole] Rash  . Penicillins Hives    Social History   Social History  . Marital Status: Married    Spouse Name: N/A  . Number of Children: N/A  . Years of Education: N/A   Occupational History  . Not on file.   Social History Main Topics  . Smoking status: Never Smoker     . Smokeless tobacco: Never Used  . Alcohol Use: No  . Drug Use: No  . Sexual Activity:    Partners: Male    Birth Control/ Protection: Surgical     Comment: btl   Other Topics Concern  . Not on file   Social History Narrative   Family History  Problem Relation Age of Onset  . Osteoporosis Mother   . Heart disease Mother   . Hypertension Mother   . Heart attack Mother        Objective:    BP 138/80 mmHg  Pulse 88  Temp(Src) 98.6 F (37 C) (Oral)  Resp 16  Ht 5\' 3"  (1.6 m)  Wt 274 lb (124.286 kg)  BMI 48.55 kg/m2  SpO2 96%  LMP 02/12/2001 Physical Exam  Constitutional: She is oriented to person, place, and time. She appears well-developed and well-nourished. No distress.  HENT:  Head: Normocephalic and atraumatic.  Eyes: Conjunctivae are normal. Pupils are equal, round, and reactive to light.  Neck: Normal range of motion. Neck supple.  Cardiovascular: Normal rate, regular rhythm and normal heart sounds.  Exam reveals no gallop and no friction rub.   No murmur heard. Pulmonary/Chest: Effort normal and breath sounds normal. She has no wheezes. She has no rales.  Neurological: She is alert  and oriented to person, place, and time.  Skin: She is not diaphoretic.  Psychiatric: She has a normal mood and affect. Her behavior is normal.  Nursing note and vitals reviewed.  Results for orders placed or performed in visit on 03/27/15  Culture, Group A Strep  Result Value Ref Range   Organism ID, Bacteria Normal Upper Respiratory Flora    Organism ID, Bacteria No Beta Hemolytic Streptococci Isolated   POCT rapid strep A  Result Value Ref Range   Rapid Strep A Screen Negative Negative       Assessment & Plan:   1. Sore throat   2. Type 2 diabetes mellitus without complication, without long-term current use of insulin ([place])     Orders Placed This Encounter  Procedures  . Culture, Group A Strep    Order Specific Question:  Source    Answer:  throat  . POCT rapid  strep A   Meds ordered this encounter  Medications  . metFORMIN (GLUMETZA) 1000 MG (MOD) 24 hr tablet    Sig: Take 1,000 mg by mouth daily with breakfast.  . glimepiride (AMARYL) 4 MG tablet    Sig: Take 4 mg by mouth daily with breakfast.  . losartan (COZAAR) 50 MG tablet    Sig: Take 50 mg by mouth daily.  . fluticasone (FLONASE) 50 MCG/ACT nasal spray    Sig: Place 2 sprays into both nostrils daily.    Dispense:  16 g    Refill:  6  . Dextromethorphan-Guaifenesin (MUCINEX DM) 30-600 MG TB12    Sig: Take 1 tablet by mouth 2 (two) times daily.    Dispense:  28 each    Refill:  0    No Follow-up on file.    Zarin Hagmann Elayne Guerin, M.D. [facility] [address] [place], [place]  [address] [phone] phone [phone] fax

## 2015-03-27 NOTE — Patient Instructions (Signed)

## 2015-03-29 LAB — CULTURE, GROUP A STREP: Organism ID, Bacteria: NORMAL

## 2015-11-23 ENCOUNTER — Encounter (HOSPITAL_COMMUNITY): Payer: Self-pay | Admitting: Neurology

## 2015-11-23 ENCOUNTER — Observation Stay (HOSPITAL_COMMUNITY)
Admission: EM | Admit: 2015-11-23 | Discharge: 2015-11-25 | Disposition: A | Discharge status: Home / Self Care | Payer: Medicare Other | Attending: Internal Medicine | Admitting: Internal Medicine

## 2015-11-23 ENCOUNTER — Emergency Department (HOSPITAL_COMMUNITY): Payer: Medicare Other

## 2015-11-23 DIAGNOSIS — Z7982 Long term (current) use of aspirin: Secondary | ICD-10-CM | POA: Diagnosis not present

## 2015-11-23 DIAGNOSIS — Z7951 Long term (current) use of inhaled steroids: Secondary | ICD-10-CM | POA: Insufficient documentation

## 2015-11-23 DIAGNOSIS — E119 Type 2 diabetes mellitus without complications: Secondary | ICD-10-CM

## 2015-11-23 DIAGNOSIS — Z880 Allergy status to penicillin: Secondary | ICD-10-CM

## 2015-11-23 DIAGNOSIS — M1990 Unspecified osteoarthritis, unspecified site: Secondary | ICD-10-CM | POA: Diagnosis not present

## 2015-11-23 DIAGNOSIS — R002 Palpitations: Secondary | ICD-10-CM | POA: Insufficient documentation

## 2015-11-23 DIAGNOSIS — M109 Gout, unspecified: Secondary | ICD-10-CM

## 2015-11-23 DIAGNOSIS — R079 Chest pain, unspecified: Secondary | ICD-10-CM | POA: Diagnosis present

## 2015-11-23 DIAGNOSIS — I10 Essential (primary) hypertension: Secondary | ICD-10-CM | POA: Diagnosis not present

## 2015-11-23 DIAGNOSIS — R931 Abnormal findings on diagnostic imaging of heart and coronary circulation: Secondary | ICD-10-CM

## 2015-11-23 DIAGNOSIS — R6884 Jaw pain: Secondary | ICD-10-CM | POA: Diagnosis not present

## 2015-11-23 DIAGNOSIS — R0789 Other chest pain: Secondary | ICD-10-CM | POA: Diagnosis not present

## 2015-11-23 DIAGNOSIS — Z8249 Family history of ischemic heart disease and other diseases of the circulatory system: Secondary | ICD-10-CM | POA: Insufficient documentation

## 2015-11-23 DIAGNOSIS — K5730 Diverticulosis of large intestine without perforation or abscess without bleeding: Secondary | ICD-10-CM | POA: Insufficient documentation

## 2015-11-23 DIAGNOSIS — Z888 Allergy status to other drugs, medicaments and biological substances status: Secondary | ICD-10-CM

## 2015-11-23 DIAGNOSIS — E669 Obesity, unspecified: Secondary | ICD-10-CM | POA: Diagnosis not present

## 2015-11-23 DIAGNOSIS — E785 Hyperlipidemia, unspecified: Secondary | ICD-10-CM | POA: Diagnosis not present

## 2015-11-23 DIAGNOSIS — Z6841 Body Mass Index (BMI) 40.0 and over, adult: Secondary | ICD-10-CM | POA: Insufficient documentation

## 2015-11-23 DIAGNOSIS — G4733 Obstructive sleep apnea (adult) (pediatric): Secondary | ICD-10-CM | POA: Diagnosis not present

## 2015-11-23 LAB — TROPONIN I: Troponin I: <0.03 ng/mL (ref <0.03)

## 2015-11-23 LAB — CBC
HEMATOCRIT: 35.3 % — AB (ref 36.0–46.0)
Hemoglobin: 11.3 g/dL — ABNORMAL LOW (ref 12.0–15.0)
MCH: 29.4 pg (ref 26.0–34.0)
MCHC: 32 g/dL (ref 30.0–36.0)
MCV: 91.9 fL (ref 78.0–100.0)
PLATELETS: 218 10*3/uL (ref 150–400)
RBC: 3.84 MIL/uL — ABNORMAL LOW (ref 3.87–5.11)
RDW: 14.7 % (ref 11.5–15.5)
WBC: 7.7 10*3/uL (ref 4.0–10.5)

## 2015-11-23 LAB — BASIC METABOLIC PANEL
Anion gap: 10 (ref 5–15)
BUN: 29 mg/dL — AB (ref 6–20)
CHLORIDE: 108 mmol/L (ref 101–111)
CO2: 20 mmol/L — AB (ref 22–32)
CREATININE: 1.17 mg/dL — AB (ref 0.44–1.00)
Calcium: 9.4 mg/dL (ref 8.9–10.3)
GFR calc Af Amer: 54 mL/min — ABNORMAL LOW (ref >60)
GFR calc non Af Amer: 47 mL/min — ABNORMAL LOW (ref >60)
Glucose, Bld: 151 mg/dL — ABNORMAL HIGH (ref 65–99)
POTASSIUM: 4.5 mmol/L (ref 3.5–5.1)
Sodium: 138 mmol/L (ref 135–145)

## 2015-11-23 LAB — GLUCOSE, CAPILLARY
GLUCOSE-CAPILLARY: 115 mg/dL — AB (ref 65–99)
Glucose-Capillary: 172 mg/dL — ABNORMAL HIGH (ref 65–99)
Glucose-Capillary: 163 mg/dL — ABNORMAL HIGH (ref 65–99)

## 2015-11-23 LAB — I-STAT TROPONIN, ED: Troponin i, poc: 0.01 ng/mL (ref 0.00–0.08)

## 2015-11-23 LAB — TSH: TSH: 2.524 u[IU]/mL (ref 0.350–4.500)

## 2015-11-23 MED ORDER — INSULIN ASPART 100 UNIT/ML ~~LOC~~ SOLN
0.0000 [IU] | Freq: Three times a day (TID) | SUBCUTANEOUS | Status: DC
  Administered 2015-11-24: 5 [IU] via SUBCUTANEOUS
  Administered 2015-11-25 (×2): 2 [IU] via SUBCUTANEOUS

## 2015-11-23 MED ORDER — NITROGLYCERIN 0.4 MG SL SUBL: 0.4000 mg | SUBLINGUAL_TABLET | SUBLINGUAL | Status: DC | PRN

## 2015-11-23 MED ORDER — ALLOPURINOL 300 MG PO TABS
300.0000 mg | ORAL_TABLET | Freq: Every day | ORAL | Status: DC
  Administered 2015-11-23 – 2015-11-25 (×3): 300 mg via ORAL
  Filled 2015-11-23 (×3): qty 1

## 2015-11-23 MED ORDER — ONDANSETRON HCL 4 MG/2ML IJ SOLN: 4.0000 mg | Freq: Four times a day (QID) | INTRAMUSCULAR | Status: DC | PRN

## 2015-11-23 MED ORDER — ACETAMINOPHEN 325 MG PO TABS: 650.0000 mg | ORAL_TABLET | ORAL | Status: DC | PRN

## 2015-11-23 MED ORDER — ASPIRIN EC 81 MG PO TBEC
81.0000 mg | DELAYED_RELEASE_TABLET | Freq: Every day | ORAL | Status: DC
  Administered 2015-11-24 – 2015-11-25 (×2): 81 mg via ORAL
  Filled 2015-11-23 (×2): qty 1

## 2015-11-23 MED ORDER — ENOXAPARIN SODIUM 40 MG/0.4ML ~~LOC~~ SOLN
40.0000 mg | Freq: Every day | SUBCUTANEOUS | Status: DC
  Administered 2015-11-23 – 2015-11-24 (×2): 40 mg via SUBCUTANEOUS
  Filled 2015-11-23 (×3): qty 0.4

## 2015-11-23 MED ORDER — LOSARTAN POTASSIUM 50 MG PO TABS
100.0000 mg | ORAL_TABLET | Freq: Every day | ORAL | Status: DC
  Administered 2015-11-24 – 2015-11-25 (×2): 100 mg via ORAL
  Filled 2015-11-23 (×2): qty 2

## 2015-11-23 NOTE — ED Notes (Signed)
Patient transported to X-ray 

## 2015-11-23 NOTE — Plan of Care (Signed)
Problem: Education: Goal: Knowledge of [facility] Education information/materials will improve Outcome: Progressing Discussed plans for echo and serial troponins.

## 2015-11-23 NOTE — ED Provider Notes (Signed)
[facility] DEPT Provider Note   CSN: [id number] Arrival date & time: 11/23/15  0940     History   Chief Complaint Chief Complaint  Patient presents with  . Chest Pain    HPI Carol Pena is a 70 y.o. female.  HPI Patient presents with episodes of chest pain. Is dull in her left-sided chest pain goes up to her jaw and shoulder. Comes and goes. States that she also feels her heart pounding at times. This is not associated with chest pain. Seen a primary care doctor Center for further evaluation. No known coronary artery disease. History. States that around 12 years ago she did have a stress test that was negative. She is pain-free now. No swelling in her legs. States she's been episodes where she is getting sweaty 2. States she has had some diarrhea but not associated with these episodes. States she does feel fatigued.  Past Medical History:  Diagnosis Date  . Arthritis   . Cataract    left eye catract  . Cholelithiasis   . Diabetes mellitus   . Diverticula of colon   . Edema of both legs   . Gout   . Hypertension     Patient Active Problem List   Diagnosis Date Noted  . Chest pain 11/23/2015  . Unspecified essential hypertension 09/29/2013    Class: History of  . Diabetes ([place]) 09/29/2013    Class: History of    Past Surgical History:  Procedure Laterality Date  . APPENDECTOMY    . c-section x2    . catract left eye    . CESAREAN SECTION    . COLONOSCOPY  01/25/2011   Procedure: COLONOSCOPY;  Surgeon: Juanita Craver, MD;  Location: [facility];  Service: Endoscopy;  Laterality: N/A;  . EYE SURGERY    . PARATHYROIDECTOMY    . RETINAL LASER PROCEDURE    . TUBAL LIGATION      OB History    Gravida Para Term Preterm AB Living   2 2 2     2    SAB TAB Ectopic Multiple Live Births           2       Home Medications    Prior to Admission medications   Medication Sig Start Date End Date Taking? Authorizing Provider  ALLOPURINOL PO Take 300 mg by  mouth daily.    Yes Historical Provider, MD  aspirin EC 81 MG tablet Take 81 mg by mouth daily.   Yes Historical Provider, MD  B Complex-C (SUPER B COMPLEX PO) Take 1 tablet by mouth daily.   Yes Historical Provider, MD  Biotin 5000 MCG CAPS Take 5,000 mcg by mouth daily.   Yes Historical Provider, MD  Calcium-Magnesium 333-167 MG TABS Take 1 tablet by mouth daily.   Yes Historical Provider, MD  Cholecalciferol (VITAMIN D3) 1000 units CAPS Take 1,000 Units by mouth daily.   Yes Historical Provider, MD  folic acid (FOLVITE) Q000111Q MCG tablet Take 800 mcg by mouth daily.   Yes Historical Provider, MD  glimepiride (AMARYL) 4 MG tablet Take 6 mg by mouth daily with breakfast.    Yes Historical Provider, MD  losartan (COZAAR) 100 MG tablet Take 100 mg by mouth daily.   Yes Historical Provider, MD  metFORMIN (GLUCOPHAGE) 1000 MG tablet Take 500-1,000 mg by mouth 3 (three) times daily. 1000 mg twice daily and 500 mg at lunch   Yes Historical Provider, MD  Turmeric 500 MG CAPS Take 500 mg by mouth  daily.   Yes Historical Provider, MD  Dextromethorphan-Guaifenesin (MUCINEX DM) 30-600 MG TB12 Take 1 tablet by mouth 2 (two) times daily. Patient not taking: Reported on 11/23/2015 03/27/15   Wardell Honour, MD  fluticasone [employer]) 50 MCG/ACT nasal spray Place 2 sprays into both nostrils daily. Patient not taking: Reported on 11/23/2015 03/27/15   Wardell Honour, MD  losartan (COZAAR) 50 MG tablet Take 50 mg by mouth daily.    Historical Provider, MD  metFORMIN (GLUMETZA) 1000 MG (MOD) 24 hr tablet Take 1,000 mg by mouth daily with breakfast.    Historical Provider, MD    Family History Family History  Problem Relation Age of Onset  . Osteoporosis Mother   . Heart disease Mother   . Hypertension Mother   . Heart attack Mother     Social History Social History  Substance Use Topics  . Smoking status: Never Smoker  . Smokeless tobacco: Never Used  . Alcohol use No     Allergies   Flagyl  [metronidazole] and Penicillins   Review of Systems Review of Systems  Constitutional: Positive for diaphoresis and fatigue. Negative for appetite change.  Respiratory: Negative for shortness of breath.   Cardiovascular: Positive for chest pain and palpitations.  Gastrointestinal: Positive for diarrhea. Negative for abdominal pain.  Musculoskeletal: Negative for back pain.  Skin: Negative for wound.  Neurological: Negative for headaches.     Physical Exam Updated Vital Signs BP 114/59   Pulse 96   Temp 98.5 F (36.9 C) (Oral)   Resp 21   Ht 5\' 3"  (1.6 m)   Wt 263 lb (119.3 kg)   LMP 02/12/2001   SpO2 98%   BMI 46.59 kg/m   Physical Exam  Constitutional: She appears well-developed.  HENT:  Head: Atraumatic.  Eyes: EOM are normal.  Neck: Neck supple. No JVD present.  Cardiovascular: Normal rate.   Abdominal: Soft. There is no tenderness.  Neurological: She is alert.  Skin: Skin is warm. Capillary refill takes less than 2 seconds.  Psychiatric: She has a normal mood and affect.     ED Treatments / Results  Labs (all labs ordered are listed, but only abnormal results are displayed) Labs Reviewed  BASIC METABOLIC PANEL - Abnormal; Notable for the following:       Result Value   CO2 20 (*)    Glucose, Bld 151 (*)    BUN 29 (*)    Creatinine, Ser 1.17 (*)    GFR calc non Af Amer 47 (*)    GFR calc Af Amer 54 (*)    All other components within normal limits  CBC - Abnormal; Notable for the following:    RBC 3.84 (*)    Hemoglobin 11.3 (*)    HCT 35.3 (*)    All other components within normal limits  TROPONIN I  TROPONIN I  TROPONIN I  I-STAT TROPOININ, ED    EKG  EKG Interpretation  Date/Time:  Wednesday November 23 2015 09:50:53 EDT Ventricular Rate:  87 PR Interval:    QRS Duration: 100 QT Interval:  363 QTC Calculation: 437 R Axis:   5 Text Interpretation:  Sinus rhythm Low voltage, precordial leads Consider anterior infarct Confirmed by  Alvino Chapel  MD, Ovid Curd [phone]) on 11/23/2015 10:19:36 AM Also confirmed by Alvino Chapel  MD, Kalven Ganim [phone]), editor [place], Joelene Millin [phone])  on 11/23/2015 10:21:12 AM       Radiology Dg Chest 2 View  Result Date: 11/23/2015 CLINICAL DATA:  Left-sided chest  pressure radiating to left jaw. Pain. Symptoms for 3 days. EXAM: CHEST  2 VIEW COMPARISON:  None. FINDINGS: Heart and mediastinal contours are within normal limits. Minimal lingular scarring. Lungs otherwise clear. No effusions. No acute bony abnormality. IMPRESSION: No active cardiopulmonary disease. Electronically Signed   By: Rolm Baptise M.D.   On: 11/23/2015 10:31    Procedures Procedures (including critical care time)  Medications Ordered in ED Medications  aspirin EC tablet 81 mg (not administered)  nitroGLYCERIN (NITROSTAT) SL tablet 0.4 mg (not administered)  acetaminophen (TYLENOL) tablet 650 mg (not administered)  ondansetron (ZOFRAN) injection 4 mg (not administered)  enoxaparin (LOVENOX) injection 40 mg (not administered)     Initial Impression / Assessment and Plan / ED Course  I have reviewed the triage vital signs and the nursing notes.  Pertinent labs & imaging results that were available during my care of the patient were reviewed by me and considered in my medical decision making (see chart for details).  Clinical Course    Patient with chest pain. EKG reassuring. Enzymes negative. She is somewhat high risk with her risk factors. Pain-free now. Will admit to internal medicine.  Final Clinical Impressions(s) / ED Diagnoses   Final diagnoses:  Chest pain, unspecified type    New Prescriptions New Prescriptions   No medications on file     Davonna Belling, MD 11/23/15 1211

## 2015-11-23 NOTE — ED Triage Notes (Signed)
Per ems- pt comes in c/o left jaw pain for several days and yesterday had several episodes of diaphoresis, left sided cp heaviness last night. Went to PCP sent here for blood work. EKG unremarkable, HR 105, given 650 mg aspirin at doctors office and she took 1-81mg  at home. Is a x 4. Denies any pain. Left AC 20 gauge. 149/76, 96% RA, CBG 175.

## 2015-11-23 NOTE — H&P (Signed)
Date: 11/23/2015               Patient Name:  Carol Pena MRN: [id number]  DOB: [birth year] Age / Sex: 70 y.o., female   PCP: Leonard Downing, MD         Medical Service: Internal Medicine Teaching Service         Attending Physician: Dr. Michel Bickers, MD    First Contact: Dr. Ledell Noss  Pager: [phone]  Second Contact: Dr. Maryellen Pile Pager: [phone]       After Hours (After 5p/  First Contact Pager: [phone]  weekends / holidays): Second Contact Pager: [phone]   Chief Complaint: jaw pain and chest heaviness   History of Present Illness: Ms. Carol Pena is a 70 y.o. female with a PMH of gout, diabetes mellitis, and hypertension who presents with jaw pain and chest heaviness. She states she had intermittent episodes of dull aching jaw and collar bone pain on the "Sunday which resolved. She thought this collar pain may be related to her chronic shoulder pain secondary to dislocation. This week she developed intermittent episodes of palpitations, diaphoresis, nausea, anxiety and diarrhea. Last night she had one episode of chest heaviness which was concerning and she went to her primary care provider this morning and was given ASA 650 x2 and sent to the ED when she described these symptoms. She denies any chest pain and states that these symptoms are not related to exertion. She does experience shortness of breath when climbing the stairs but states that this is normal for her. She says she's been feeling fatigued and had a lot of stress in her life recently related to the excitement of new grandchildren and a her mother being sick. He denies shortness of breath, cough, orthopnea, leg swelling or abdominal pain. In the ED she was found to be tachycardic with otherwise normal vital signs. EKG was showed sinus tachycardia and troponin 0.01.   Meds:  Prescriptions Prior to Admission  Medication Sig Dispense Refill Last Dose  . ALLOPURINOL PO Take 300 mg by mouth daily.     10" /11/2015 at Unknown time  . aspirin EC 81 MG tablet Take 81 mg by mouth daily.   11/22/2015 at Unknown time  . B Complex-C (SUPER B COMPLEX PO) Take 1 tablet by mouth daily.   11/22/2015 at Unknown time  . Biotin 5000 MCG CAPS Take 5,000 mcg by mouth daily.   11/22/2015 at Unknown time  . Calcium-Magnesium 333-167 MG TABS Take 1 tablet by mouth daily.   11/22/2015 at Unknown time  . Cholecalciferol (VITAMIN D3) 1000 units CAPS Take 1,000 Units by mouth daily.   11/22/2015 at Unknown time  . folic acid (FOLVITE) Q000111Q MCG tablet Take 800 mcg by mouth daily.   11/22/2015 at Unknown time  . glimepiride (AMARYL) 4 MG tablet Take 6 mg by mouth daily with breakfast.    11/23/2015 at Unknown time  . losartan (COZAAR) 100 MG tablet Take 100 mg by mouth daily.   11/23/2015 at Unknown time  . metFORMIN (GLUCOPHAGE) 1000 MG tablet Take 500-1,000 mg by mouth 3 (three) times daily. 1000 mg twice daily and 500 mg at lunch   11/22/2015 at Unknown time  . Turmeric 500 MG CAPS Take 500 mg by mouth daily.   11/22/2015 at Unknown time  . Dextromethorphan-Guaifenesin (MUCINEX DM) 30-600 MG TB12 Take 1 tablet by mouth 2 (two) times daily. (Patient not taking: Reported on 11/23/2015) 28 each 0 Not Taking  at Unknown time  . fluticasone (FLONASE) 50 MCG/ACT nasal spray Place 2 sprays into both nostrils daily. (Patient not taking: Reported on 11/23/2015) 16 g 6 Not Taking at Unknown time  . losartan (COZAAR) 50 MG tablet Take 50 mg by mouth daily.   Not Taking at Unknown time  . metFORMIN (GLUMETZA) 1000 MG (MOD) 24 hr tablet Take 1,000 mg by mouth daily with breakfast.   Not Taking at Unknown time     Allergies: Allergies as of 11/23/2015 - Review Complete 11/23/2015  Allergen Reaction Noted  . Flagyl [metronidazole] Rash 01/25/2011  . Penicillins Hives 01/22/2011   Past Medical History:  Diagnosis Date  . Arthritis   . Cataract    left eye catract  . Cholelithiasis   . Diabetes mellitus   . Diverticula of  colon   . Edema of both legs   . Gout   . Hypertension     Family History:  Mother- MI at age 67 with secondary LBBB   Social History:  She lives with her husband she is a retired Radio producer. Denies tobacco alcohol or drug use.  Review of Systems: A complete ROS was negative except as per HPI.   Physical Exam: Vitals:   11/23/15 1145 11/23/15 1215 11/23/15 1230 11/23/15 1326  BP: 114/59 115/61 108/56 107/64  Pulse: 96 94 96 92  Resp: 21  15   Temp:    98.1 F (36.7 C)  TempSrc:    Oral  SpO2: 98% 97% 97% 98%  Weight:    265 lb 11.2 oz (120.5 kg)  Height:    5\' 3"  (1.6 m)   Physical Exam  Constitutional: She is oriented to person, place, and time. She appears well-developed and well-nourished. No distress.  Eyes: EOM are normal. Pupils are equal, round, and reactive to light.  Neck: Normal range of motion. No thyromegaly present.  Cardiovascular: Normal rate and regular rhythm.   Murmur heard. Pulmonary/Chest: She has no wheezes. She has no rales.  Abdominal: Soft. There is no tenderness. There is no guarding.  Musculoskeletal: She exhibits edema. She exhibits no tenderness.  Lymphadenopathy:    She has no cervical adenopathy.  Neurological: She is alert and oriented to person, place, and time.  Psychiatric: She has a normal mood and affect. Her behavior is normal.    Labs: CBC:  Recent Labs Lab 11/23/15 0949  WBC 7.7  HGB 11.3*  HCT 35.3*  MCV 91.9  PLT 99991111   Basic Metabolic Panel:  Recent Labs Lab 11/23/15 0949  NA 138  K 4.5  CL 108  CO2 20*  GLUCOSE 151*  BUN 29*  CREATININE 1.17*  CALCIUM 9.4   Cardiac Enzymes:  Recent Labs Lab 11/23/15 1001 11/23/15 1233  TROPONINI  --  <0.03  TROPIPOC 0.01  --    EKG: EKG: sinus tachycardia, low voltage.  Imaging: Chest xray  Enlarged aorta, no acute cardiopulmonary disease   Assessment & Plan by Problem:    Chest heaviness   Ms. Carol Pena is a 70 y.o. female with PMH gout,  diabetes mellitis, and hypertension who presents with jaw pain, chest heaviness, and episode of tachycardia. In the ED EKG showed no signs of acute coronary ischemia and one negative troponin. We will continue trending her troponin. She has no history of coronary artery disease but we have ordered a lipid panel and A1c to further assess her cardiac risk factors which include gout, HTN, HLD, OSA and DM. Given the recent stressors in  her life we are considering takotsubo and have ordered ECHO to evaluate. Another consideration for these episodes of palpitations and diaphoresis is thyroid dysfunction, we have ordered TSH to evaluate.  -trending troponin x2  -follow up ECHO -NPO for now in case she requires cardiac procedure tomorrow.     Essential hypertension She is currently normotensive, we have continued her home medication losartan 100 mg daily.     Diabetes ([facility]) She is on Metfrormin 1000 mg and glimepride 6 mg daily at home. We will monitor with CBG and ISS.      Gout We have continued her home medication allopurinol 300 mg daily.   F none  E none  N NPO at midnight  DVT Ppx lovenox   Code Status FULL   Dispo: Admit patient to Observation with expected length of stay less than 2 midnights.  Signed: Ledell Noss, MD 11/23/2015, 7:01 PM  Pager: [phone]

## 2015-11-24 ENCOUNTER — Observation Stay (HOSPITAL_BASED_OUTPATIENT_CLINIC_OR_DEPARTMENT_OTHER): Payer: Medicare Other

## 2015-11-24 ENCOUNTER — Observation Stay (HOSPITAL_COMMUNITY): Payer: Medicare Other

## 2015-11-24 ENCOUNTER — Encounter (HOSPITAL_COMMUNITY): Payer: Self-pay | Admitting: Physician Assistant

## 2015-11-24 DIAGNOSIS — E785 Hyperlipidemia, unspecified: Secondary | ICD-10-CM | POA: Diagnosis not present

## 2015-11-24 DIAGNOSIS — E119 Type 2 diabetes mellitus without complications: Secondary | ICD-10-CM | POA: Diagnosis not present

## 2015-11-24 DIAGNOSIS — R0789 Other chest pain: Secondary | ICD-10-CM | POA: Diagnosis not present

## 2015-11-24 DIAGNOSIS — E7800 Pure hypercholesterolemia, unspecified: Secondary | ICD-10-CM

## 2015-11-24 DIAGNOSIS — R079 Chest pain, unspecified: Secondary | ICD-10-CM

## 2015-11-24 DIAGNOSIS — R002 Palpitations: Secondary | ICD-10-CM | POA: Diagnosis not present

## 2015-11-24 DIAGNOSIS — Z7984 Long term (current) use of oral hypoglycemic drugs: Secondary | ICD-10-CM

## 2015-11-24 DIAGNOSIS — Z79899 Other long term (current) drug therapy: Secondary | ICD-10-CM

## 2015-11-24 DIAGNOSIS — I10 Essential (primary) hypertension: Secondary | ICD-10-CM | POA: Diagnosis not present

## 2015-11-24 DIAGNOSIS — M109 Gout, unspecified: Secondary | ICD-10-CM

## 2015-11-24 DIAGNOSIS — Z8249 Family history of ischemic heart disease and other diseases of the circulatory system: Secondary | ICD-10-CM

## 2015-11-24 DIAGNOSIS — R6884 Jaw pain: Secondary | ICD-10-CM | POA: Diagnosis not present

## 2015-11-24 LAB — BASIC METABOLIC PANEL
Anion gap: 10 (ref 5–15)
BUN: 28 mg/dL — AB (ref 6–20)
CO2: 22 mmol/L (ref 22–32)
CREATININE: 1.18 mg/dL — AB (ref 0.44–1.00)
Calcium: 9.1 mg/dL (ref 8.9–10.3)
Chloride: 107 mmol/L (ref 101–111)
GFR calc Af Amer: 54 mL/min — ABNORMAL LOW (ref >60)
GFR, EST NON AFRICAN AMERICAN: 46 mL/min — AB (ref >60)
Glucose, Bld: 136 mg/dL — ABNORMAL HIGH (ref 65–99)
POTASSIUM: 4.7 mmol/L (ref 3.5–5.1)
SODIUM: 139 mmol/L (ref 135–145)

## 2015-11-24 LAB — NM MYOCAR MULTI W/SPECT W/WALL MOTION / EF
CHL CUP NUCLEAR SDS: 2
CHL CUP NUCLEAR SRS: 15
CHL CUP NUCLEAR SSS: 17
CHL CUP RESTING HR STRESS: 98 {beats}/min
Exercise duration (min): 4 min
LHR: 0.03
LV dias vol: 49 mL (ref 46–106)
LV sys vol: 22 mL
NUC STRESS TID: 1.5

## 2015-11-24 LAB — LIPID PANEL
CHOLESTEROL: 163 mg/dL (ref 0–200)
HDL: 38 mg/dL — ABNORMAL LOW (ref >40)
LDL Cholesterol: 46 mg/dL (ref 0–99)
Total CHOL/HDL Ratio: 4.3 RATIO
Triglycerides: 396 mg/dL — ABNORMAL HIGH (ref <150)
VLDL: 79 mg/dL — ABNORMAL HIGH (ref 0–40)

## 2015-11-24 LAB — HEMOGLOBIN A1C
Hgb A1c MFr Bld: 6.8 % — ABNORMAL HIGH (ref 4.8–5.6)
MEAN PLASMA GLUCOSE: 148 mg/dL

## 2015-11-24 LAB — GLUCOSE, CAPILLARY
GLUCOSE-CAPILLARY: 256 mg/dL — AB (ref 65–99)
Glucose-Capillary: 157 mg/dL — ABNORMAL HIGH (ref 65–99)
Glucose-Capillary: 156 mg/dL — ABNORMAL HIGH (ref 65–99)
Glucose-Capillary: 181 mg/dL — ABNORMAL HIGH (ref 65–99)

## 2015-11-24 LAB — TROPONIN I

## 2015-11-24 MED ORDER — SODIUM CHLORIDE 0.9 % WEIGHT BASED INFUSION
1.0000 mL/kg/h | INTRAVENOUS | Status: DC
Start: 2015-11-25 — End: 2015-11-25

## 2015-11-24 MED ORDER — SODIUM CHLORIDE 0.9% FLUSH
3.0000 mL | Freq: Two times a day (BID) | INTRAVENOUS | Status: DC
  Administered 2015-11-25: 3 mL via INTRAVENOUS

## 2015-11-24 MED ORDER — TECHNETIUM TC 99M TETROFOSMIN IV KIT
10.0000 | PACK | Freq: Once | INTRAVENOUS | Status: AC | PRN
  Administered 2015-11-24: 10 via INTRAVENOUS

## 2015-11-24 MED ORDER — MAGNESIUM OXIDE 400 (241.3 MG) MG PO TABS
400.0000 mg | ORAL_TABLET | Freq: Every day | ORAL | Status: DC
  Administered 2015-11-24 – 2015-11-25 (×2): 400 mg via ORAL
  Filled 2015-11-24 (×2): qty 1

## 2015-11-24 MED ORDER — SODIUM CHLORIDE 0.9 % WEIGHT BASED INFUSION
3.0000 mL/kg/h | INTRAVENOUS | Status: DC
  Administered 2015-11-25: 3 mL/kg/h via INTRAVENOUS

## 2015-11-24 MED ORDER — SODIUM CHLORIDE 0.9% FLUSH: 3.0000 mL | INTRAVENOUS | Status: DC | PRN

## 2015-11-24 MED ORDER — SODIUM CHLORIDE 0.9 % IV SOLN: 250.0000 mL | INTRAVENOUS | Status: DC | PRN

## 2015-11-24 MED ORDER — TECHNETIUM TC 99M TETROFOSMIN IV KIT
30.0000 | PACK | Freq: Once | INTRAVENOUS | Status: AC | PRN
  Administered 2015-11-24: 30 via INTRAVENOUS

## 2015-11-24 MED ORDER — REGADENOSON 0.4 MG/5ML IV SOLN
INTRAVENOUS | Status: AC
  Filled 2015-11-24: qty 5

## 2015-11-24 MED ORDER — ASPIRIN 81 MG PO CHEW
324.0000 mg | CHEWABLE_TABLET | ORAL | Status: AC
  Administered 2015-11-25: 324 mg via ORAL
  Filled 2015-11-24: qty 4

## 2015-11-24 MED ORDER — REGADENOSON 0.4 MG/5ML IV SOLN
0.4000 mg | Freq: Once | INTRAVENOUS | Status: AC
  Administered 2015-11-24: 0.4 mg via INTRAVENOUS
  Filled 2015-11-24: qty 5

## 2015-11-24 NOTE — Progress Notes (Signed)
Patient has expressed that she would like a social work consult so she can develop a Living Will. Put in for a social work consult. Patient has written some of her wishes down and will give to social work tomorrow.   Patient signed and understood her consent for her Cath tomorrow.   Margaret Pyle, RN

## 2015-11-24 NOTE — Progress Notes (Signed)
Tolerated Lexiscan well, final report pending  Kerin Ransom PA-C 11/24/2015 12:34 PM

## 2015-11-24 NOTE — Care Management Obs Status (Signed)
[facility] NOTIFICATION   Patient Details  Name: Lessa Cator MRN: [id number] Date of Birth: [birth year]   Medicare Observation Status Notification Given:  Yes    Bethena Roys, RN 11/24/2015, 10:28 AM

## 2015-11-24 NOTE — Progress Notes (Signed)
   Subjective:  No events overnight. Her symptoms have resolved since admission. She denies any chest heaviness or jaw pain. No nausea, vomiting or further palpitations. Tells me the most strenuous activity she does at home is taking care of her 68 year old grandson. She is able to lift him without difficulty. Denies any shortness of breath.   Objective:  Vital signs in last 24 hours: Vitals:   11/24/15 1231 11/24/15 1234 11/24/15 1235 11/24/15 1350  BP: (!) 145/89 (!) 152/72 125/73 111/76  Pulse:    (!) 104  Resp:    18  Temp:    98.5 F (36.9 C)  TempSrc:    Oral  SpO2:    99%  Weight:      Height:       Physical Exam  Constitutional: She is oriented to person, place, and time. She appears well-developed and well-nourished. No distress.  Cardiovascular: Normal rate and regular rhythm. Systolic murmur heard best LSB. No JVD.  Pulmonary/Chest: She has no wheezes. She has no rales.  Abdominal: Soft. There is no tenderness. There is no guarding.  Musculoskeletal: She exhibits edema. She exhibits no tenderness.  Neurological: She is alert and oriented to person, place, and time.  Psychiatric: She has a normal mood and affect. Her behavior is normal.   Assessment/Plan:  Chest heaviness  No further episodes of chest heaviness or jaw pain since admission. Troponin negative x3 overnight. EKG without acute ischemic changes. Evaluated by cardiology today who recommended chemical stress test today given her risk factors, family history and symptoms. Her scan was initially read as a low risk study, however, after discussing with Dr. Radford Pax there was evidence of transient ischemic dilatation with LV cavity dilation during exercise. Possibly indicating multivessel CAD with balanced ischemia. She would like her to undergo cath tomorrow for further evaluation.  -NPO at [facility] for cath  Palpitations No further palpitations. Has never had any palpations before. No arrhythmias noted on telemetry. Will  continue telemetry while here. Arrange for outpatient event monitoring at discharge.   Hyperlipidemia:  ASCVD Risk 18.5%. Would benefit from high intensity statin. Will discuss prior to discharge.   Essential hypertension She is currently normotensive, we have continued her home medication losartan 100 mg daily.   Diabetes ([place]) She is on Metfrormin 1000 mg and glimepride 6 mg daily at home. We will monitor with CBG and ISS.    Gout We have continued her home medication allopurinol 300 mg daily.   F none  E none  N NPO at midnight  DVT Ppx lovenox   Code Status FULL   Dispo: Anticipated discharge in approximately 1-2 day(s).   Maryellen Pile, MD 11/24/2015, 4:40 PM Pager: [phone]

## 2015-11-24 NOTE — Progress Notes (Signed)
Date of Admission:  11/23/2015     Principal Problem:   Chest pain, moderate coronary artery risk Active Problems:   Essential hypertension   Diabetes (HCC)   HLD (hyperlipidemia)   Gout   OSA (obstructive sleep apnea)   . regadenoson      . allopurinol  300 mg Oral Daily  . aspirin EC  81 mg Oral Daily  . enoxaparin (LOVENOX) injection  40 mg Subcutaneous Daily  . insulin aspart  0-9 Units Subcutaneous TID WC  . losartan  100 mg Oral Daily  . magnesium oxide  400 mg Oral Daily    SUBJECTIVE: Ms. Veasy is a 70 year old female who developed some aching pain in her left jaw 5 days ago. She also had some intermittent aching in her left shoulder but initially attributed that to prior shoulder separation and some chronic discomfort. She then developed some dull retrosternal pain yesterday. She also had some rapid, pounding heartbeat, sweats and felt anxious. She had one episode of nausea and vomiting and some loose stools. She has also been very tired. Her husband is currently out of town. Her daughter and daughter-in-law are currently pregnant. Her daughter-in-law has been having some problem with intermittent vaginal bleeding. Her mother has also had some health difficulties recently. She states that all of this has put her under a great deal of stress. Her mother suffered a heart attack when she was 43. Ms. Schnell became concerned that she might be having a heart attack and came to the emergency department. She has not had any further chest pain or palpitations since arrival.  Review of Systems: Review of Systems  Constitutional: Negative for chills, diaphoresis and fever.  Respiratory: Positive for shortness of breath.        Chronic, mild and unchanged dyspnea on exertion.  Cardiovascular: Positive for chest pain.  Gastrointestinal: Negative for abdominal pain and heartburn.  Psychiatric/Behavioral: The patient is nervous/anxious.     Past Medical History:  Diagnosis Date    . Arthritis   . Cataract    left eye catract  . Cholelithiasis   . Diabetes mellitus   . Diverticula of colon   . Edema of both legs   . Gout   . Hypertension     Social History  Substance Use Topics  . Smoking status: Never Smoker  . Smokeless tobacco: Never Used  . Alcohol use No    Family History  Problem Relation Age of Onset  . Osteoporosis Mother   . Heart disease Mother   . Hypertension Mother   . Heart attack Mother    Allergies  Allergen Reactions  . Flagyl [Metronidazole] Rash  . Penicillins Hives    OBJECTIVE: Vitals:   11/24/15 1231 11/24/15 1234 11/24/15 1235 11/24/15 1350  BP: (!) 145/89 (!) 152/72 125/73 111/76  Pulse:    (!) 104  Resp:    18  Temp:    98.5 F (36.9 C)  TempSrc:    Oral  SpO2:    99%  Weight:      Height:       Body mass index is 46.13 kg/m.  Physical Exam  Constitutional: She is oriented to person, place, and time.  She is sitting up in a chair visiting with her daughter. She is in good spirits.  Cardiovascular: Normal rate and regular rhythm.   No murmur heard. Pulmonary/Chest: Effort normal and breath sounds normal. She has no wheezes. She has no rales.  Abdominal: Soft. There is no  tenderness.  Neurological: She is alert and oriented to person, place, and time.  Skin: No rash noted.  Psychiatric: Mood and affect normal.    Lab Results Lab Results  Component Value Date   WBC 7.7 11/23/2015   HGB 11.3 (L) 11/23/2015   HCT 35.3 (L) 11/23/2015   MCV 91.9 11/23/2015   PLT 218 11/23/2015    Lab Results  Component Value Date   CREATININE 1.18 (H) 11/24/2015   BUN 28 (H) 11/24/2015   NA 139 11/24/2015   K 4.7 11/24/2015   CL 107 11/24/2015   CO2 22 11/24/2015   No results found for: ALT, AST, GGT, ALKPHOS, BILITOT   Troponin normal 3  EKG: Normal sinus rhythm without acute changes  Microbiology: No results found for this or any previous visit (from the past 240 hour(s)).   ASSESSMENT: She has  multiple risk factors for heart disease including strong family history of premature coronary disease, hypertension, diabetes and dyslipidemia. She has relatively sedentary. She has no evidence of an acute coronary syndrome. I appreciate cardiology's assistance and agree with Myoview stress test and echocardiogram.  PLAN: 1. Await results of stress test and echocardiogram  Michel Bickers, MD [facility] [phone] pager   [phone] cell 11/24/2015, 2:03 PM

## 2015-11-24 NOTE — Consult Note (Signed)
CARDIOLOGY CONSULT NOTE   Patient ID: Carol Pena MRN: [id number] DOB/AGE: 68/05/1945 68 y.o.  Admit date: 11/23/2015  Primary Physician   Leonard Downing, MD Primary Cardiologist   New - Dr Radford Pax Reason for Consultation   Chest pain Requesting MD: Dr Megan Salon  [id number] Carol Pena is a 70 y.o. year old female with a history of gout, DM, HTN, LE edema. Admitted 10/11 w/ CP, cards asked to see.   Pt had stress test by Dr [place] > 10 yr ago, ok. No other cardiac eval.  5 days ago, pt began having L jaw pain, aching, 2-3/10, lasting less than 5 minutes. This was not exertional. There were no aggravating or alleviating factors. It was not associated with shortness of breath, nausea, vomiting, or diaphoresis. She had several episodes, but less than 10. The symptoms concerned her.  At the same time, she began having palpitations that were associated with cold sweat. She has had 3 or 4 episodes of those. They were not at the same time as the chest pain. She had not been taking any over-the-counter cold medications and had not had any unusual amount of caffeine. After they started, she was under a little bit of extra stress because her husband was out of town. However, the palpitations had started before her husband was out of town.  She had never had either the palpitations or the chest pain before.  She is not very active, doing things around the house is her most strenuous activity. She has a knee that hurts with ambulation, especially going down stairs. She tries to avoid stairs. She admits that she does not stick tightly to a diabetic diet, but her last hemoglobin A1c was 6.7.  She occasionally keeps her grandson who is less than 31 years old, and when she lifts him or is caring for him, that is her maximum exertion. She has not had any symptoms in that setting.    Past Medical History:  Diagnosis Date  . Arthritis   . Cataract    left eye catract  . Cholelithiasis     . Diabetes mellitus   . Diverticula of colon   . Edema of both legs   . Gout   . Hypertension      Past Surgical History:  Procedure Laterality Date  . APPENDECTOMY    . c-section x2    . catract left eye    . CESAREAN SECTION    . COLONOSCOPY  01/25/2011   Procedure: COLONOSCOPY;  Surgeon: Juanita Craver, MD;  Location: [facility] ENDOSCOPY;  Service: Endoscopy;  Laterality: N/A;  . EYE SURGERY    . PARATHYROIDECTOMY    . RETINAL LASER PROCEDURE    . TUBAL LIGATION      Allergies  Allergen Reactions  . Flagyl [Metronidazole] Rash  . Penicillins Hives    I have reviewed the patient's current medications . allopurinol  300 mg Oral Daily  . aspirin EC  81 mg Oral Daily  . enoxaparin (LOVENOX) injection  40 mg Subcutaneous Daily  . insulin aspart  0-9 Units Subcutaneous TID WC  . losartan  100 mg Oral Daily     acetaminophen, nitroGLYCERIN, ondansetron (ZOFRAN) IV  Prior to Admission medications   Medication Sig Start Date End Date Taking? Authorizing Provider  ALLOPURINOL PO Take 300 mg by mouth daily.    Yes Historical Provider, MD  aspirin EC 81 MG tablet Take 81 mg by mouth daily.   Yes Historical Provider, MD  B Complex-C (SUPER B COMPLEX PO) Take 1 tablet by mouth daily.   Yes Historical Provider, MD  Biotin 5000 MCG CAPS Take 5,000 mcg by mouth daily.   Yes Historical Provider, MD  Calcium-Magnesium 333-167 MG TABS Take 1 tablet by mouth daily.   Yes Historical Provider, MD  Cholecalciferol (VITAMIN D3) 1000 units CAPS Take 1,000 Units by mouth daily.   Yes Historical Provider, MD  folic acid (FOLVITE) Q000111Q MCG tablet Take 800 mcg by mouth daily.   Yes Historical Provider, MD  glimepiride (AMARYL) 4 MG tablet Take 6 mg by mouth daily with breakfast.    Yes Historical Provider, MD  losartan (COZAAR) 100 MG tablet Take 100 mg by mouth daily.   Yes Historical Provider, MD  metFORMIN (GLUCOPHAGE) 1000 MG tablet Take 500-1,000 mg by mouth 3 (three) times daily. 1000 mg twice  daily and 500 mg at lunch   Yes Historical Provider, MD  Turmeric 500 MG CAPS Take 500 mg by mouth daily.   Yes Historical Provider, MD  Dextromethorphan-Guaifenesin (MUCINEX DM) 30-600 MG TB12 Take 1 tablet by mouth 2 (two) times daily. Patient not taking: Reported on 11/23/2015 03/27/15   Wardell Honour, MD  fluticasone [facility]) 50 MCG/ACT nasal spray Place 2 sprays into both nostrils daily. Patient not taking: Reported on 11/23/2015 03/27/15   Wardell Honour, MD  losartan (COZAAR) 50 MG tablet Take 50 mg by mouth daily.    Historical Provider, MD  metFORMIN (GLUMETZA) 1000 MG (MOD) 24 hr tablet Take 1,000 mg by mouth daily with breakfast.    Historical Provider, MD     Social History   Social History  . Marital status: Married    Spouse name: N/A  . Number of children: N/A  . Years of education: N/A   Occupational History  . Retired    Social History Main Topics  . Smoking status: Never Smoker  . Smokeless tobacco: Never Used  . Alcohol use No  . Drug use: No  . Sexual activity: Yes    Partners: Male    Birth control/ protection: Surgical     Comment: btl   Other Topics Concern  . Not on file   Social History Narrative   She lives in [place] with her husband.    Family Status  Relation Status  . Mother Deceased  . Father Deceased   Family History  Problem Relation Age of Onset  . Osteoporosis Mother   . Heart disease Mother   . Hypertension Mother   . Heart attack Mother      ROS:  Full 14 point review of systems complete and found to be negative unless listed above.  Physical Exam: Blood pressure (!) 106/56, pulse 91, temperature 98.8 F (37.1 C), resp. rate 19, height 5\' 3"  (1.6 m), weight 260 lb 6.4 oz (118.1 kg), last menstrual period 02/12/2001, SpO2 98 %.  General: Well developed, well nourished, female in no acute distress Head: Eyes PERRLA, No xanthomas.   Normocephalic and atraumatic, oropharynx without edema or exudate. Dentition:  Fair Lungs: Clear bilaterally Heart: HRRR S1 S2, no rub/gallop, no murmur. pulses are 2+ all 4 extrem.   Neck: No carotid bruits. No lymphadenopathy.  JVD not elevated Abdomen: Bowel sounds present, abdomen soft and non-tender without masses or hernias noted. Msk:  No spine or cva tenderness. No weakness, no joint deformities or effusions. Extremities: No clubbing or cyanosis. No edema.  Neuro: Alert and oriented X 3. No focal deficits noted.  Psych:  Good affect, responds appropriately Skin: No rashes or lesions noted.  Labs:   Lab Results  Component Value Date   WBC 7.7 11/23/2015   HGB 11.3 (L) 11/23/2015   HCT 35.3 (L) 11/23/2015   MCV 91.9 11/23/2015   PLT 218 11/23/2015     Recent Labs Lab 11/24/15 0311  NA 139  K 4.7  CL 107  CO2 22  BUN 28*  CREATININE 1.18*  CALCIUM 9.1  GLUCOSE 136*   No results found for: MG  Recent Labs  11/23/15 1233 11/23/15 1919 11/23/15 2315  TROPONINI <0.03 <0.03 <0.03    Recent Labs  11/23/15 1001  TROPIPOC 0.01   Lab Results  Component Value Date   CHOL 163 11/24/2015   HDL 38 (L) 11/24/2015   LDLCALC 46 11/24/2015   TRIG 396 (H) 11/24/2015   TSH  Date/Time Value Ref Range Status  11/23/2015 07:19 PM 2.524 0.350 - 4.500 uIU/mL Final    Comment:    Performed by a 3rd Generation assay with a functional sensitivity of <=0.01 uIU/mL.   Echo: Ordered  ECG:  11/23/2015 Sinus rhythm, mildly decreased voltage in precordial leads, no acute ischemic changes   Radiology:  Dg Chest 2 View Result Date: 11/23/2015 CLINICAL DATA:  Left-sided chest pressure radiating to left jaw. Pain. Symptoms for 3 days. EXAM: CHEST  2 VIEW COMPARISON:  None. FINDINGS: Heart and mediastinal contours are within normal limits. Minimal lingular scarring. Lungs otherwise clear. No effusions. No acute bony abnormality. IMPRESSION: No active cardiopulmonary disease. Electronically Signed   By: Rolm Baptise M.D.   On: 11/23/2015 10:31     ASSESSMENT AND PLAN:   The patient was seen today by Dr. Radford Pax, the patient evaluated and the data reviewed.  Principal Problem: 1.  Chest pain -Enzymes are negative for MI, ECG is not acute, echo pending - She has no ongoing ischemic symptoms, but her activity level is poor baseline. - Because her knee problems, she does not wish she would be able to do a treadmill - At this time and do not believe cardiac catheterization is indicated - Mild discuss with M.D., consider Lexi scan Myoview, it would be a 2 day study  2.   Palpitations - never had before.  - only ST seen on telemetry - that was likely in the setting of leg cramps which pt had several times last pm. - she normally takes Mg qhs, will reorder - MD advise on event monitor.  Otherwise, per IM Active Problems:   Essential hypertension   Diabetes ([place])   HLD (hyperlipidemia)   Gout   OSA (obstructive sleep apnea)   Signed: Rosaria Ferries, PA-C 11/24/2015 8:17 AM Beeper [id number]  Co-Sign MD

## 2015-11-24 NOTE — Progress Notes (Signed)
I have reviewed the images from the nuclear stress test.  The test was read as low risk with no ischemia and septal infarct with dysynchrony of the septum.  This test is actually high risk as there is evidence of transient ischemic dilatation (TID 1.5) where the LV cavity dilates during exercise.  This can be seen with multivessel CAD and she could have balanced ischemia making the scan look normal as far as perfusion defects go.  Patient will be made NPO for cath in am.

## 2015-11-25 ENCOUNTER — Encounter (HOSPITAL_COMMUNITY): Payer: Self-pay | Admitting: Cardiology

## 2015-11-25 ENCOUNTER — Other Ambulatory Visit: Payer: Self-pay | Admitting: Physician Assistant

## 2015-11-25 ENCOUNTER — Encounter (HOSPITAL_COMMUNITY)
Admission: EM | Disposition: A | Discharge status: Home / Self Care | Payer: Self-pay | Source: Home / Self Care | Attending: Emergency Medicine

## 2015-11-25 ENCOUNTER — Observation Stay (HOSPITAL_BASED_OUTPATIENT_CLINIC_OR_DEPARTMENT_OTHER): Payer: Medicare Other

## 2015-11-25 DIAGNOSIS — R002 Palpitations: Secondary | ICD-10-CM

## 2015-11-25 DIAGNOSIS — R079 Chest pain, unspecified: Secondary | ICD-10-CM

## 2015-11-25 DIAGNOSIS — E119 Type 2 diabetes mellitus without complications: Secondary | ICD-10-CM | POA: Diagnosis not present

## 2015-11-25 DIAGNOSIS — R6884 Jaw pain: Secondary | ICD-10-CM | POA: Diagnosis not present

## 2015-11-25 DIAGNOSIS — R0789 Other chest pain: Secondary | ICD-10-CM | POA: Diagnosis not present

## 2015-11-25 DIAGNOSIS — I10 Essential (primary) hypertension: Secondary | ICD-10-CM | POA: Diagnosis not present

## 2015-11-25 DIAGNOSIS — E7800 Pure hypercholesterolemia, unspecified: Secondary | ICD-10-CM | POA: Diagnosis not present

## 2015-11-25 DIAGNOSIS — R931 Abnormal findings on diagnostic imaging of heart and coronary circulation: Secondary | ICD-10-CM

## 2015-11-25 HISTORY — PX: CARDIAC CATHETERIZATION: SHX172

## 2015-11-25 LAB — BASIC METABOLIC PANEL
ANION GAP: 9 (ref 5–15)
BUN: 20 mg/dL (ref 6–20)
CALCIUM: 8.9 mg/dL (ref 8.9–10.3)
CO2: 24 mmol/L (ref 22–32)
Chloride: 101 mmol/L (ref 101–111)
Creatinine, Ser: 1.18 mg/dL — ABNORMAL HIGH (ref 0.44–1.00)
GFR, EST AFRICAN AMERICAN: 54 mL/min — AB (ref >60)
GFR, EST NON AFRICAN AMERICAN: 46 mL/min — AB (ref >60)
Glucose, Bld: 166 mg/dL — ABNORMAL HIGH (ref 65–99)
POTASSIUM: 4.1 mmol/L (ref 3.5–5.1)
SODIUM: 134 mmol/L — AB (ref 135–145)

## 2015-11-25 LAB — PROTIME-INR
INR: 1.02
PROTHROMBIN TIME: 13.4 s (ref 11.4–15.2)

## 2015-11-25 LAB — GLUCOSE, CAPILLARY
GLUCOSE-CAPILLARY: 157 mg/dL — AB (ref 65–99)
Glucose-Capillary: 184 mg/dL — ABNORMAL HIGH (ref 65–99)
Glucose-Capillary: 97 mg/dL (ref 65–99)

## 2015-11-25 LAB — ECHOCARDIOGRAM COMPLETE
HEIGHTINCHES: 63 in
WEIGHTICAEL: 4184 [oz_av]

## 2015-11-25 SURGERY — LEFT HEART CATH AND CORONARY ANGIOGRAPHY

## 2015-11-25 MED ORDER — HEPARIN (PORCINE) IN NACL 2-0.9 UNIT/ML-% IJ SOLN
INTRAMUSCULAR | Status: DC | PRN
  Administered 2015-11-25: 500 mL

## 2015-11-25 MED ORDER — SODIUM CHLORIDE 0.9 % IV SOLN: 250.0000 mL | INTRAVENOUS | Status: DC | PRN

## 2015-11-25 MED ORDER — FENTANYL CITRATE (PF) 100 MCG/2ML IJ SOLN
INTRAMUSCULAR | Status: AC
  Filled 2015-11-25: qty 2

## 2015-11-25 MED ORDER — SODIUM CHLORIDE 0.9% FLUSH: 3.0000 mL | Freq: Two times a day (BID) | INTRAVENOUS | Status: DC

## 2015-11-25 MED ORDER — HEPARIN SODIUM (PORCINE) 1000 UNIT/ML IJ SOLN
INTRAMUSCULAR | Status: DC | PRN
  Administered 2015-11-25: 5500 [IU] via INTRAVENOUS

## 2015-11-25 MED ORDER — IOPAMIDOL (ISOVUE-370) INJECTION 76%
INTRAVENOUS | Status: DC | PRN
  Administered 2015-11-25: 70 mL via INTRA_ARTERIAL

## 2015-11-25 MED ORDER — LIDOCAINE HCL (PF) 1 % IJ SOLN
INTRAMUSCULAR | Status: AC
  Filled 2015-11-25: qty 30

## 2015-11-25 MED ORDER — MIDAZOLAM HCL 2 MG/2ML IJ SOLN
INTRAMUSCULAR | Status: DC | PRN
  Administered 2015-11-25: 1 mg via INTRAVENOUS

## 2015-11-25 MED ORDER — HEPARIN SODIUM (PORCINE) 1000 UNIT/ML IJ SOLN
INTRAMUSCULAR | Status: AC
  Filled 2015-11-25: qty 1

## 2015-11-25 MED ORDER — ATORVASTATIN CALCIUM 40 MG PO TABS: 40.0000 mg | ORAL_TABLET | Freq: Every day | ORAL | 0 refills | Status: DC

## 2015-11-25 MED ORDER — VERAPAMIL HCL 2.5 MG/ML IV SOLN
INTRAVENOUS | Status: AC
  Filled 2015-11-25: qty 2

## 2015-11-25 MED ORDER — SODIUM CHLORIDE 0.9 % IV SOLN
INTRAVENOUS | Status: DC | PRN
  Administered 2015-11-25: 1 mL/kg/h via INTRAVENOUS

## 2015-11-25 MED ORDER — IOPAMIDOL (ISOVUE-370) INJECTION 76%
INTRAVENOUS | Status: AC
  Filled 2015-11-25: qty 100

## 2015-11-25 MED ORDER — FENTANYL CITRATE (PF) 100 MCG/2ML IJ SOLN
INTRAMUSCULAR | Status: DC | PRN
  Administered 2015-11-25: 25 ug via INTRAVENOUS

## 2015-11-25 MED ORDER — LIDOCAINE HCL (PF) 1 % IJ SOLN
INTRAMUSCULAR | Status: DC | PRN
  Administered 2015-11-25: 2 mL

## 2015-11-25 MED ORDER — HEPARIN (PORCINE) IN NACL 2-0.9 UNIT/ML-% IJ SOLN
INTRAMUSCULAR | Status: AC
  Filled 2015-11-25: qty 500

## 2015-11-25 MED ORDER — ATORVASTATIN CALCIUM 40 MG PO TABS: 40.0000 mg | ORAL_TABLET | Freq: Every day | ORAL | Status: DC

## 2015-11-25 MED ORDER — VERAPAMIL HCL 2.5 MG/ML IV SOLN
INTRAVENOUS | Status: DC | PRN
  Administered 2015-11-25: 10 mL via INTRA_ARTERIAL

## 2015-11-25 MED ORDER — SODIUM CHLORIDE 0.9 % WEIGHT BASED INFUSION: 1.0000 mL/kg/h | INTRAVENOUS | Status: DC

## 2015-11-25 MED ORDER — SODIUM CHLORIDE 0.9% FLUSH: 3.0000 mL | INTRAVENOUS | Status: DC | PRN

## 2015-11-25 MED ORDER — MIDAZOLAM HCL 2 MG/2ML IJ SOLN
INTRAMUSCULAR | Status: AC
  Filled 2015-11-25: qty 2

## 2015-11-25 SURGICAL SUPPLY — 12 items
CATH 5FR JL3.5 JR4 ANG PIG MP (CATHETERS) ×1 IMPLANT
DEVICE RAD COMP TR BAND LRG (VASCULAR PRODUCTS) ×1 IMPLANT
GLIDESHEATH INTRODUCER 6F 10CM (SHEATH) ×1 IMPLANT
KIT HEART LEFT (KITS) ×2 IMPLANT
NDL PERC 21GX4CM (NEEDLE) IMPLANT
NEEDLE PERC 21GX4CM (NEEDLE) ×2 IMPLANT
PACK CARDIAC CATHETERIZATION (CUSTOM PROCEDURE TRAY) ×2 IMPLANT
SYR MEDRAD MARK V 150ML (SYRINGE) ×2 IMPLANT
TRANSDUCER W/STOPCOCK (MISCELLANEOUS) ×2 IMPLANT
TUBING CIL FLEX 10 FLL-RA (TUBING) ×2 IMPLANT
WIRE HI TORQ VERSACORE-J 145CM (WIRE) ×1 IMPLANT
WIRE SAFE-T 1.5MM-J .035X260CM (WIRE) ×1 IMPLANT

## 2015-11-25 NOTE — Clinical Social Work Note (Signed)
CSW acknowledges consult for advanced directives. Will have RN put in a consult for the chaplain.  CSW signing off. Consult again if any social work needs arise.  Dayton Scrape, Louise

## 2015-11-25 NOTE — Progress Notes (Signed)
SUBJECTIVE:  No complaints this am  OBJECTIVE:   Vitals:   Vitals:   11/24/15 1235 11/24/15 1350 11/24/15 2217 11/25/15 0559  BP: 125/73 111/76 (!) 115/49 (!) 127/49  Pulse:  (!) 104 94 90  Resp:  18    Temp:  98.5 F (36.9 C) 98.5 F (36.9 C) 98.2 F (36.8 C)  TempSrc:  Oral Oral Oral  SpO2:  99% 99% 99%  Weight:    261 lb 8 oz (118.6 kg)  Height:       I&O's:   Intake/Output Summary (Last 24 hours) at 11/25/15 [id number] Last data filed at 11/25/15 [id number]  Gross per 24 hour  Intake                0 ml  Output                0 ml  Net                0 ml   TELEMETRY: Reviewed telemetry pt in NSR:     PHYSICAL EXAM General: Well developed, well nourished, in no acute distress Head: Eyes PERRLA, No xanthomas.   Normal cephalic and atramatic  Lungs:   Clear bilaterally to auscultation and percussion. Heart:   HRRR S1 S2 Pulses are 2+ & equal.. Abdomen: Bowel sounds are positive, abdomen soft and non-tender without masses Msk:  Back normal, normal gait. Normal strength and tone for age. Extremities:   No clubbing, cyanosis or edema.  DP +1 Neuro: Alert and oriented X 3. Psych:  Good affect, responds appropriately   LABS: Basic Metabolic Panel:  Recent Labs  11/24/15 0311 11/25/15 0253  NA 139 134*  K 4.7 4.1  CL 107 101  CO2 22 24  GLUCOSE 136* 166*  BUN 28* 20  CREATININE 1.18* 1.18*  CALCIUM 9.1 8.9   Liver Function Tests: No results for input(s): AST, ALT, ALKPHOS, BILITOT, PROT, ALBUMIN in the last 72 hours. No results for input(s): LIPASE, AMYLASE in the last 72 hours. CBC:  Recent Labs  11/23/15 0949  WBC 7.7  HGB 11.3*  HCT 35.3*  MCV 91.9  PLT 218   Cardiac Enzymes:  Recent Labs  11/23/15 1233 11/23/15 1919 11/23/15 2315  TROPONINI <0.03 <0.03 <0.03   BNP: Invalid input(s): POCBNP D-Dimer: No results for input(s): DDIMER in the last 72 hours. Hemoglobin A1C:  Recent Labs  11/23/15 0949  HGBA1C 6.8*   Fasting Lipid  Panel:  Recent Labs  11/24/15 0311  CHOL 163  HDL 38*  LDLCALC 46  TRIG 396*  CHOLHDL 4.3   Thyroid Function Tests:  Recent Labs  11/23/15 1919  TSH 2.524   Anemia Panel: No results for input(s): VITAMINB12, FOLATE, FERRITIN, TIBC, IRON, RETICCTPCT in the last 72 hours. Coag Panel:   Lab Results  Component Value Date   INR 1.02 11/25/2015    RADIOLOGY: Dg Chest 2 View  Result Date: 11/23/2015 CLINICAL DATA:  Left-sided chest pressure radiating to left jaw. Pain. Symptoms for 3 days. EXAM: CHEST  2 VIEW COMPARISON:  None. FINDINGS: Heart and mediastinal contours are within normal limits. Minimal lingular scarring. Lungs otherwise clear. No effusions. No acute bony abnormality. IMPRESSION: No active cardiopulmonary disease. Electronically Signed   By: Rolm Baptise M.D.   On: 11/23/2015 10:31   Nm Myocar Multi W/spect W/wall Motion / Ef  Result Date: 11/24/2015  There was no ST segment deviation noted during stress.  No T wave inversion was noted during  stress.  Defect 1: There is a medium defect of moderate severity present in the basal inferoseptal and mid inferoseptal location.  Findings consistent with prior myocardial infarction.  This is a low risk study. There was no significant ischemia identified.  Nuclear stress EF: 55%. There is base to mid septal wall dyssynchrony  Candee Furbish, MD   ASSESSMENT AND PLAN:    Principal Problem: 1.  Chest pain - Enzymes are negative for MI, ECG is not acute, echo pending - She has no ongoing ischemic symptoms, but her activity level is poor baseline. - I have reviewed the images from the nuclear stress test.  The test was read as low risk with no ischemia and septal infarct with dysynchrony of the septum.  This test is actually high risk as there is evidence of transient ischemic dilatation (TID 1.5) where the LV cavity dilates during exercise.  This can be seen with multivessel CAD and she could have balanced ischemia making the  scan look normal as far as perfusion defects go.  Will plan left heart cath today to define coronary anatomy.  Cardiac catheterization was discussed with the patient fully. The patient understands that risks include but are not limited to stroke (1 in 1000), death (1 in 31), kidney failure [usually temporary] (1 in 500), bleeding (1 in 200), allergic reaction [possibly serious] (1 in 200).  The patient understands and is willing to proceed.    2.   Palpitations - never had before.  - only ST seen on telemetry - Will get outpt 30 day heart monitor  3.  HTN  - BP controlled on current meds.  - Continue ARB  4.  Hyperlipidemia - Not on meds.  If she has CAD will need to be placed on statin.    5.  DM  - metformin on hold for cath    Fransico Him, MD  11/25/2015  8:07 AM

## 2015-11-25 NOTE — Care Management Note (Signed)
Case Management Note  Patient Details  Name: Carol Pena MRN: [id number] Date of Birth: 1945/07/19  Subjective/Objective:  Pt presented for chest pain.  Enzymes are negative for MI. Plan for cardiac cath.               Action/Plan: No needs identified at time of discussion for patient. CM will continue to monitor.   Expected Discharge Date:                  Expected Discharge Plan:  Home/Self Care  In-House Referral:  NA  Discharge planning Services  CM Consult  Post Acute Care Choice:  NA Choice offered to:  NA  DME Arranged:  N/A DME Agency:  NA  HH Arranged:  NA HH Agency:  NA  Status of Service:  Completed, signed off  If discussed at [place] of Stay Meetings, dates discussed:    Additional Comments:  Bethena Roys, RN 11/25/2015, 10:17 AM

## 2015-11-25 NOTE — Progress Notes (Signed)
  Echocardiogram 2D Echocardiogram has been performed.  Carol Pena 11/25/2015, 10:49 AM

## 2015-11-25 NOTE — Progress Notes (Signed)
Responded to page to assist patient with AD.  During visit patient was interested in Durable POA. AD form was left with pt. Chaplain available as needed.

## 2015-11-25 NOTE — H&P (View-Only) (Signed)
SUBJECTIVE:  No complaints this am  OBJECTIVE:   Vitals:   Vitals:   11/24/15 1235 11/24/15 1350 11/24/15 2217 11/25/15 0559  BP: 125/73 111/76 (!) 115/49 (!) 127/49  Pulse:  (!) 104 94 90  Resp:  18    Temp:  98.5 F (36.9 C) 98.5 F (36.9 C) 98.2 F (36.8 C)  TempSrc:  Oral Oral Oral  SpO2:  99% 99% 99%  Weight:    261 lb 8 oz (118.6 kg)  Height:       I&O's:   Intake/Output Summary (Last 24 hours) at 11/25/15 [id number] Last data filed at 11/25/15 [id number]  Gross per 24 hour  Intake                0 ml  Output                0 ml  Net                0 ml   TELEMETRY: Reviewed telemetry pt in NSR:     PHYSICAL EXAM General: Well developed, well nourished, in no acute distress Head: Eyes PERRLA, No xanthomas.   Normal cephalic and atramatic  Lungs:   Clear bilaterally to auscultation and percussion. Heart:   HRRR S1 S2 Pulses are 2+ & equal.. Abdomen: Bowel sounds are positive, abdomen soft and non-tender without masses Msk:  Back normal, normal gait. Normal strength and tone for age. Extremities:   No clubbing, cyanosis or edema.  DP +1 Neuro: Alert and oriented X 3. Psych:  Good affect, responds appropriately   LABS: Basic Metabolic Panel:  Recent Labs  11/24/15 0311 11/25/15 0253  NA 139 134*  K 4.7 4.1  CL 107 101  CO2 22 24  GLUCOSE 136* 166*  BUN 28* 20  CREATININE 1.18* 1.18*  CALCIUM 9.1 8.9   Liver Function Tests: No results for input(s): AST, ALT, ALKPHOS, BILITOT, PROT, ALBUMIN in the last 72 hours. No results for input(s): LIPASE, AMYLASE in the last 72 hours. CBC:  Recent Labs  11/23/15 0949  WBC 7.7  HGB 11.3*  HCT 35.3*  MCV 91.9  PLT 218   Cardiac Enzymes:  Recent Labs  11/23/15 1233 11/23/15 1919 11/23/15 2315  TROPONINI <0.03 <0.03 <0.03   BNP: Invalid input(s): POCBNP D-Dimer: No results for input(s): DDIMER in the last 72 hours. Hemoglobin A1C:  Recent Labs  11/23/15 0949  HGBA1C 6.8*   Fasting Lipid  Panel:  Recent Labs  11/24/15 0311  CHOL 163  HDL 38*  LDLCALC 46  TRIG 396*  CHOLHDL 4.3   Thyroid Function Tests:  Recent Labs  11/23/15 1919  TSH 2.524   Anemia Panel: No results for input(s): VITAMINB12, FOLATE, FERRITIN, TIBC, IRON, RETICCTPCT in the last 72 hours. Coag Panel:   Lab Results  Component Value Date   INR 1.02 11/25/2015    RADIOLOGY: Dg Chest 2 View  Result Date: 11/23/2015 CLINICAL DATA:  Left-sided chest pressure radiating to left jaw. Pain. Symptoms for 3 days. EXAM: CHEST  2 VIEW COMPARISON:  None. FINDINGS: Heart and mediastinal contours are within normal limits. Minimal lingular scarring. Lungs otherwise clear. No effusions. No acute bony abnormality. IMPRESSION: No active cardiopulmonary disease. Electronically Signed   By: Rolm Baptise M.D.   On: 11/23/2015 10:31   Nm Myocar Multi W/spect W/wall Motion / Ef  Result Date: 11/24/2015  There was no ST segment deviation noted during stress.  No T wave inversion was noted during  stress.  Defect 1: There is a medium defect of moderate severity present in the basal inferoseptal and mid inferoseptal location.  Findings consistent with prior myocardial infarction.  This is a low risk study. There was no significant ischemia identified.  Nuclear stress EF: 55%. There is base to mid septal wall dyssynchrony  Candee Furbish, MD   ASSESSMENT AND PLAN:    Principal Problem: 1.  Chest pain - Enzymes are negative for MI, ECG is not acute, echo pending - She has no ongoing ischemic symptoms, but her activity level is poor baseline. - I have reviewed the images from the nuclear stress test.  The test was read as low risk with no ischemia and septal infarct with dysynchrony of the septum.  This test is actually high risk as there is evidence of transient ischemic dilatation (TID 1.5) where the LV cavity dilates during exercise.  This can be seen with multivessel CAD and she could have balanced ischemia making the  scan look normal as far as perfusion defects go.  Will plan left heart cath today to define coronary anatomy.  Cardiac catheterization was discussed with the patient fully. The patient understands that risks include but are not limited to stroke (1 in 1000), death (1 in 56), kidney failure [usually temporary] (1 in 500), bleeding (1 in 200), allergic reaction [possibly serious] (1 in 200).  The patient understands and is willing to proceed.    2.   Palpitations - never had before.  - only ST seen on telemetry - Will get outpt 30 day heart monitor  3.  HTN  - BP controlled on current meds.  - Continue ARB  4.  Hyperlipidemia - Not on meds.  If she has CAD will need to be placed on statin.    5.  DM  - metformin on hold for cath    Fransico Him, MD  11/25/2015  8:07 AM

## 2015-11-25 NOTE — Progress Notes (Signed)
   Date of Admission:  11/23/2015            She has not had any further chest pain since admission. Her cardiac catheterization did not show any abnormalities today. Because of her chest pain is unclear but certainly does not appear to to cardiac sources. I agree with discharge home today and outpatient follow-up.  Michel Bickers, MD [facility] [place] [facility] [phone] pager   [phone] cell 02/15/2015, 1:32 PM

## 2015-11-25 NOTE — Discharge Instructions (Signed)
Radial Site Care °Refer to this sheet in the next few weeks. These instructions provide you with information about caring for yourself after your procedure. Your health care provider may also give you more specific instructions. Your treatment has been planned according to current medical practices, but problems sometimes occur. Call your health care provider if you have any problems or questions after your procedure. °WHAT TO EXPECT AFTER THE PROCEDURE °After your procedure, it is typical to have the following: °· Bruising at the radial site that usually fades within 1-2 weeks. °· Blood collecting in the tissue (hematoma) that may be painful to the touch. It should usually decrease in size and tenderness within 1-2 weeks. °HOME CARE INSTRUCTIONS °· Take medicines only as directed by your health care provider. °· You may shower 24-48 hours after the procedure or as directed by your health care provider. Remove the bandage (dressing) and gently wash the site with plain soap and water. Pat the area dry with a clean towel. Do not rub the site, because this may cause bleeding. °· Do not take baths, swim, or use a hot tub until your health care provider approves. °· Check your insertion site every day for redness, swelling, or drainage. °· Do not apply powder or lotion to the site. °· Do not flex or bend the affected arm for 24 hours or as directed by your health care provider. °· Do not push or pull heavy objects with the affected arm for 24 hours or as directed by your health care provider. °· Do not lift over 10 lb (4.5 kg) for 5 days after your procedure or as directed by your health care provider. °· Ask your health care provider when it is okay to: °¨ Return to work or school. °¨ Resume usual physical activities or sports. °¨ Resume sexual activity. °· Do not drive home if you are discharged the same day as the procedure. Have someone else drive you. °· You may drive 24 hours after the procedure unless otherwise  instructed by your health care provider. °· Do not operate machinery or power tools for 24 hours after the procedure. °· If your procedure was done as an outpatient procedure, which means that you went home the same day as your procedure, a responsible adult should be with you for the first 24 hours after you arrive home. °· Keep all follow-up visits as directed by your health care provider. This is important. °SEEK MEDICAL CARE IF: °· You have a fever. °· You have chills. °· You have increased bleeding from the radial site. Hold pressure on the site. °SEEK IMMEDIATE MEDICAL CARE IF: °· You have unusual pain at the radial site. °· You have redness, warmth, or swelling at the radial site. °· You have drainage (other than a small amount of blood on the dressing) from the radial site. °· The radial site is bleeding, and the bleeding does not stop after 30 minutes of holding steady pressure on the site. °· Your arm or hand becomes pale, cool, tingly, or numb. °  °This information is not intended to replace advice given to you by your health care provider. Make sure you discuss any questions you have with your health care provider. °  °Document Released: 03/03/2010 Document Revised: 02/19/2014 Document Reviewed: 08/17/2013 °Elsevier Interactive Patient Education ©2016 [employer] ° °

## 2015-11-25 NOTE — Research (Signed)
CADLAD Informed Consent   Subject Name: Carol Pena  Subject met inclusion and exclusion criteria.  The informed consent form, study requirements and expectations were reviewed with the subject and questions and concerns were addressed prior to the signing of the consent form.  The subject verbalized understanding of the trail requirements.  The subject agreed to participate in the CADLAD trial and signed the informed consent.  The informed consent was obtained prior to performance of any protocol-specific procedures for the subject.  A copy of the signed informed consent was given to the subject and a copy was placed in the subject's medical record.  Dick Jr, Ernest Henry 11/25/2015, 11:15 am  

## 2015-11-25 NOTE — Interval H&P Note (Signed)
History and Physical Interval Note:  11/25/2015 2:05 PM  Carol Pena  has presented today for surgery, with the diagnosis of abnormal nuc  The various methods of treatment have been discussed with the patient and family. After consideration of risks, benefits and other options for treatment, the patient has consented to  Procedure(s): Left Heart Cath and Coronary Angiography (N/A) as a surgical intervention .  The patient's history has been reviewed, patient examined, no change in status, stable for surgery.  I have reviewed the patient's chart and labs.  Questions were answered to the patient's satisfaction.   Cath Lab Visit (complete for each Cath Lab visit)  Clinical Evaluation Leading to the Procedure:   ACS: Yes.    Non-ACS:    Anginal Classification: CCS III  Anti-ischemic medical therapy: Minimal Therapy (1 class of medications)  Non-Invasive Test Results: High-risk stress test findings: cardiac mortality >3%/year  Prior CABG: No previous CABG        Carol Pena [facility] 11/25/2015 2:05 PM

## 2015-11-25 NOTE — Progress Notes (Signed)
   Subjective: Carol Pena had an abnormal nuclear stress test yesterday and has plans for cardiac cath today. Today she denies any chest pain, heaviness, jaw pain, abdominal pain. She has no new concerns or complaints and says she is ready for catheterization.  Objective:  Vital signs in last 24 hours: Vitals:   11/25/15 1410 11/25/15 1415 11/25/15 1420 11/25/15 1425  BP: 120/68 117/66 (!) 114/59 121/69  Pulse: 95 94 95 99  Resp: 18 15 16 15   Temp:      TempSrc:      SpO2: 100% 100% 100% 97%  Weight:      Height:       Physical Exam  Constitutional: She appears well-developed and well-nourished. No distress.  Cardiovascular: Regular rhythm.  Tachycardia present.   No murmur heard. Pulmonary/Chest: She has no wheezes. She has no rales.  Abdominal: Soft. She exhibits no distension. There is no tenderness.  Extremities: no calf tenderness, no peripheral edema   Medications: Infusions: . sodium chloride 1 mL/kg/hr (11/25/15 1445)   Scheduled Medications: . allopurinol  300 mg Oral Daily  . aspirin EC  81 mg Oral Daily  . insulin aspart  0-9 Units Subcutaneous TID WC  . losartan  100 mg Oral Daily  . magnesium oxide  400 mg Oral Daily  . sodium chloride flush  3 mL Intravenous Q12H   PRN Medications: sodium chloride, ondansetron (ZOFRAN) IV, sodium chloride flush  Assessment/Plan:   Chest pain, moderate coronary artery risk   Abnormal nuclear cardiac imaging test No further episodes of chest heaviness or jaw pain since prior to admission. Abnormal nuclear stress test yesterday with be followed-up with cath today. We will plan her medical therapy based on cath results.    Essential hypertension Remains normotensive. Continue to her home losartan 100 mg daily.    Diabetes ([place]) We will continue to monitor with CBG and ISS.     HLD (hyperlipidemia) ASCVD Risk 18.5%. Started atorvastatin 40 mg daily.   Dispo: Anticipated discharge today pending cardiac cath  results   LOS: 0 days   Ledell Noss, MD 11/25/2015, 3:10 PM Pager: [phone]

## 2015-11-28 NOTE — Discharge Summary (Signed)
Name: Carol Pena MRN: [id number] DOB: 07/16/1945 70 y.o. PCP: Leonard Downing, MD  Date of Admission: 11/23/2015  9:40 AM Date of Discharge: 11/25/2015 Attending Physician: Dr. Michel Bickers  Discharge Diagnosis: Principal Problem:   Chest pain, moderate coronary artery risk Active Problems:   Essential hypertension   Diabetes ([place])   HLD (hyperlipidemia)   Gout   OSA (obstructive sleep apnea)   Abnormal nuclear cardiac imaging test   Discharge Medications:   Medication List    TAKE these medications   ALLOPURINOL PO Take 300 mg by mouth daily.   aspirin EC 81 MG tablet Take 81 mg by mouth daily.   atorvastatin 40 MG tablet Commonly known as:  LIPITOR Take 1 tablet (40 mg total) by mouth daily at 6 PM.   Biotin 5000 MCG Caps Take 5,000 mcg by mouth daily.   Calcium-Magnesium 333-167 MG Tabs Take 1 tablet by mouth daily.   fluticasone 50 MCG/ACT nasal spray Commonly known as:  FLONASE Place 2 sprays into both nostrils daily.   folic acid Q000111Q MCG tablet Commonly known as:  FOLVITE Take 800 mcg by mouth daily.   glimepiride 4 MG tablet Commonly known as:  AMARYL Take 6 mg by mouth daily with breakfast.   losartan 100 MG tablet Commonly known as:  COZAAR Take 100 mg by mouth daily. What changed:  Another medication with the same name was removed. Continue taking this medication, and follow the directions you see here.   metFORMIN 1000 MG tablet Commonly known as:  GLUCOPHAGE Take 500-1,000 mg by mouth 3 (three) times daily. 1000 mg twice daily and 500 mg at lunch   metFORMIN 1000 MG (MOD) 24 hr tablet Commonly known as:  GLUMETZA Take 1,000 mg by mouth daily with breakfast.   MUCINEX DM 30-600 MG Tb12 Take 1 tablet by mouth 2 (two) times daily.   SUPER B COMPLEX PO Take 1 tablet by mouth daily.   Turmeric 500 MG Caps Take 500 mg by mouth daily.   Vitamin D3 1000 units Caps Take 1,000 Units by mouth daily.       Disposition and  follow-up:   Ms.Carol Pena was discharged from [facility] in Good condition.  At the hospital follow up visit please address:  1.  Tachycardia - has she set up appointment for 30 day event monitor for tachycardia?   2.  Labs / imaging needed at time of follow-up: BMET (Crt 1.18, GFR 46 during this admission)  3.  Pending labs/ test needing follow-up: none   Follow-up Appointments: Follow-up Information    Leonard Downing, MD. Call on 11/28/2015.   Specialty:  Family Medicine Why:  We have scheduled an appointment for you at [facility] information: [place] [address] [phone]        [facility] [employer] [facility] .   Specialty:  Cardiology Why:  The Cardiology office will call you and set up heart monitor Contact information: [address], [place] [facility] Course by problem list: Principal Problem:   Chest pain, moderate coronary artery risk Active Problems:   Essential hypertension   Diabetes ([facility])   HLD (hyperlipidemia)   Gout   OSA (obstructive sleep apnea)   Abnormal nuclear cardiac imaging test   Non ischemic chest pain  Ms. Carol Pena presented after an episode of dull aching jaw and collar bone pain which  resolved but was followed by intermittent episodes of palpatations, diaphoresis, nausea, anxiety, diarrhea, and chest heaviness. She had cardiac risk factors including HTN, HLD, OSA and DM and was admitted for chest pain rule out. She had negative troponins, and an EKG which showed no signs of acute coronary ischemia. ECHO showed and EF 55-60% without wall motion abnormality. Nuclear stress test showed a moderate severity defect in basal and mid inferoseptal locations and an area of transient ischemic dilation which occurred during exercise. This defect can be seen with multivessel CAD. Coronary catheterization showed normal coronary anatomy  and no concern for ischemia. She had a greater than 7.5 % 10- year risk for atherosclerotic cardiovascular disease so she was discharged on atorvastatin 40 mg daily. She did continue to have sinus tachycardia with intermittent sensation of palpitations, cardiology recommended 30 day event monitor for this.   Essential hypertension  Her blood pressure remained well controlled on her home medication losartan 100 mg daily.   Diabetes  Hemoglobin A1C 10/11 6.8. Her sugar was monitored with CBG and sliding scale insulin.    Discharge Vitals:   BP 123/64 (BP Location: Left Arm)   Pulse 99   Temp 98.2 F (36.8 C) (Oral)   Resp 15   Ht 5\' 3"  (1.6 m)   Wt 261 lb 8 oz (118.6 kg)   LMP 02/12/2001   SpO2 98%   BMI 46.32 kg/m   Procedures Performed:  Dg Chest 2 View  Result Date: 11/23/2015 CLINICAL DATA:  Left-sided chest pressure radiating to left jaw. Pain. Symptoms for 3 days. EXAM: CHEST  2 VIEW COMPARISON:  None. FINDINGS: Heart and mediastinal contours are within normal limits. Minimal lingular scarring. Lungs otherwise clear. No effusions. No acute bony abnormality. IMPRESSION: No active cardiopulmonary disease. Electronically Signed   By: Rolm Baptise M.D.   On: 11/23/2015 10:31   Nm Myocar Multi W/spect W/wall Motion / Ef  Result Date: 11/24/2015  There was no ST segment deviation noted during stress.  No T wave inversion was noted during stress.  Defect 1: There is a medium defect of moderate severity present in the basal inferoseptal and mid inferoseptal location.  Findings consistent with prior myocardial infarction.  This is a low risk study. There was no significant ischemia identified.  Nuclear stress EF: 55%. There is base to mid septal wall dyssynchrony  Candee Furbish, MD    2D Echo:  - Left ventricle: The cavity size was normal. There was mild concentric hypertrophy. Systolic function was normal. The estimated ejection fraction was in the range of 55% to 60%. Wall motion  was normal; there were no regional wall motion abnormalities. Doppler parameters are consistent with abnormal left ventricular relaxation (grade 1 diastolic dysfunction). - Left atrium: The atrium was mildly dilated.  Cardiac Cath: The left ventricular systolic function is normal. LV end diastolic pressure is normal. The left ventricular ejection fraction is 55-65% by visual estimate  Consultations: Treatment Team:  Rounding [facility], MD  Discharge Instructions: Discharge Instructions    Call MD for:  difficulty breathing, headache or visual disturbances    Complete by:  As directed    Call MD for:  persistant nausea and vomiting    Complete by:  As directed    Call MD for:  severe uncontrolled pain    Complete by:  As directed    Diet - low sodium heart healthy    Complete by:  As directed    Increase activity slowly    Complete  by:  As directed       Signed: Ledell Noss, MD 11/28/2015, 6:35 PM   Pager: [phone]

## 2015-12-07 ENCOUNTER — Telehealth: Payer: Self-pay | Admitting: Nurse Practitioner

## 2015-12-07 ENCOUNTER — Encounter: Payer: Self-pay | Admitting: *Deleted

## 2015-12-07 ENCOUNTER — Ambulatory Visit (INDEPENDENT_AMBULATORY_CARE_PROVIDER_SITE_OTHER): Payer: Medicare Other

## 2015-12-07 DIAGNOSIS — R002 Palpitations: Secondary | ICD-10-CM

## 2015-12-07 NOTE — Progress Notes (Signed)
I am fine with that 

## 2015-12-07 NOTE — Telephone Encounter (Signed)
I'm fine with this.

## 2015-12-07 NOTE — Telephone Encounter (Signed)
Routing to Dr. Acie Fredrickson for agreement to follow patient.  Need documented agreement from Drs. Turner and Nahser.

## 2015-12-07 NOTE — Progress Notes (Signed)
Patient ID: Carol Pena, female   DOB: 12/16/1945, 70 y.o.   MRN: [id number] Patient originally scheduled to see Dr. Stanford Breed as a new patient.  Appointment cancelled when patient hospitalized.  Patient states she was assigned to follow up with Dr. Radford Pax, since followed by her in hospital.  Patient is schedule for follow up with Ellen Henri, PA-C on 01/09/2016.  She would like to know if possible to be followed by Dr. Acie Fredrickson thereafter.  She states she is a personal acquaintance of the family.

## 2015-12-08 NOTE — Telephone Encounter (Signed)
That sound fine with me

## 2015-12-09 NOTE — Telephone Encounter (Signed)
Documentation made on patient's upcoming appointment notes with Ellen Henri, PA.  Follow-up appointments may be scheduled with Dr. Acie Fredrickson.

## 2015-12-19 ENCOUNTER — Ambulatory Visit: Payer: Medicare Other | Admitting: Cardiology

## 2016-01-09 ENCOUNTER — Ambulatory Visit (INDEPENDENT_AMBULATORY_CARE_PROVIDER_SITE_OTHER): Payer: Medicare Other | Admitting: Cardiology

## 2016-01-09 ENCOUNTER — Encounter: Payer: Self-pay | Admitting: Cardiology

## 2016-01-09 VITALS — BP 118/66 | HR 70 | Ht 63.0 in | Wt 212.0 lb

## 2016-01-09 DIAGNOSIS — R002 Palpitations: Secondary | ICD-10-CM | POA: Diagnosis not present

## 2016-01-09 NOTE — Patient Instructions (Addendum)
Medication Instructions:  Your physician recommends that you continue on your current medications as directed. Please refer to the Current Medication list given to you today.   Labwork: None orderd  Testing/Procedures: None ordered  Follow-Up: Your physician recommends that you schedule a follow-up appointment in: 3 MONTHS WITH DR. Acie Fredrickson Any Other Special Instructions Will Be Listed Below (If Applicable).     If you need a refill on your cardiac medications before your next appointment, please call your pharmacy.

## 2016-01-09 NOTE — Progress Notes (Signed)
01/09/2016 Carol Pena   [birth year]  [id number]  Primary Physician Hayden Rasmussen., MD Primary Cardiologist: Dr. Acie Fredrickson    Reason for Visit/CC:  [facility] F/u for CP and Palpitations.   HPI:  70 yo WF who recently underwent w/u for chest pain and palpitations. She was admitted to [facility] and ruled out for MI.  She had an abnormal myoview study. She underwent a LHC per Dr. Martinique on 11/25/15 which showed normal coronary anatomy and normal LVF and normal LV EDP. EF was 55-60%. Echocardiogram also confirmed normal LVEF at 55-60%. Grade 1 DD was noted. No significant valvular abnormalties. Given her symptoms of palpitations, she was ordered to wear a 30 day monitor. She was also started on a BB, metoprolol XL, 25 mg daily. Her monitor has not been reviewed by MD yet. I have personally reviewed results. No adverse cardiac arrhthymias noted. She notes her palpitations have resolved. She is tolerating metoprolol well w/o side effects. BP and HR are both stable. No CP or dyspnea. No post cath complications. Her radial cath site is stable.     Current Meds  Medication Sig  . allopurinol (ZYLOPRIM) 300 MG tablet   . aspirin EC 81 MG tablet Take 81 mg by mouth daily.  . B Complex-C (SUPER B COMPLEX PO) Take 1 tablet by mouth daily.  . Biotin 5000 MCG CAPS Take 5,000 mcg by mouth daily.  . Calcium-Magnesium 333-167 MG TABS Take 1 tablet by mouth daily.  . Cholecalciferol (VITAMIN D3) 1000 units CAPS Take 1,000 Units by mouth daily.  Marland Kitchen Dextromethorphan-Guaifenesin (MUCINEX DM) 30-600 MG TB12 Take 1 tablet by mouth 2 (two) times daily.  . folic acid (FOLVITE) Q000111Q MCG tablet Take 800 mcg by mouth daily.  Marland Kitchen glimepiride (AMARYL) 4 MG tablet Take 6 mg by mouth daily with breakfast.   . losartan (COZAAR) 100 MG tablet Take 100 mg by mouth daily.  . metFORMIN (GLUCOPHAGE) 1000 MG tablet Take 500-1,000 mg by mouth 3 (three) times daily. 1000 mg twice daily and 500 mg at lunch  . metoprolol succinate  (TOPROL-XL) 25 MG 24 hr tablet   . Turmeric 500 MG CAPS Take 500 mg by mouth daily.   Allergies  Allergen Reactions  . Flagyl [Metronidazole] Rash  . Penicillins Hives   Past Medical History:  Diagnosis Date  . Arthritis   . Cataract    left eye catract  . Cholelithiasis   . Diabetes mellitus   . Diverticula of colon   . Edema of both legs   . Gout   . Hypertension    Family History  Problem Relation Age of Onset  . Osteoporosis Mother   . Heart disease Mother   . Hypertension Mother   . Heart attack Mother    Past Surgical History:  Procedure Laterality Date  . APPENDECTOMY    . c-section x2    . CARDIAC CATHETERIZATION N/A 11/25/2015   Procedure: Left Heart Cath and Coronary Angiography;  Surgeon: Peter M Martinique, MD;  Location: [place] [facility];  Service: Cardiovascular;  Laterality: N/A;  . catract left eye    . CESAREAN SECTION    . COLONOSCOPY  01/25/2011   Procedure: COLONOSCOPY;  Surgeon: Juanita Craver, MD;  Location: [facility];  Service: Endoscopy;  Laterality: N/A;  . EYE SURGERY    . PARATHYROIDECTOMY    . RETINAL LASER PROCEDURE    . TUBAL LIGATION     Social History   Social History  . Marital status:  Married    Spouse name: N/A  . Number of children: N/A  . Years of education: N/A   Occupational History  . Retired    Social History Main Topics  . Smoking status: Never Smoker  . Smokeless tobacco: Never Used  . Alcohol use No  . Drug use: No  . Sexual activity: Yes    Partners: Male    Birth control/ protection: Surgical     Comment: btl   Other Topics Concern  . Not on file   Social History Narrative   She lives in [place] with her husband.     Review of Systems: General: negative for chills, fever, night sweats or weight changes.  Cardiovascular: negative for chest pain, dyspnea on exertion, edema, orthopnea, palpitations, paroxysmal nocturnal dyspnea or shortness of breath Dermatological: negative for  rash Respiratory: negative for cough or wheezing Urologic: negative for hematuria Abdominal: negative for nausea, vomiting, diarrhea, bright red blood per rectum, melena, or hematemesis Neurologic: negative for visual changes, syncope, or dizziness All other systems reviewed and are otherwise negative except as noted above.   Physical Exam:  Blood pressure 118/66, pulse 70, height 5\' 3"  (1.6 m), weight 212 lb (96.2 kg), last menstrual period 02/12/2001.  General appearance: alert, cooperative, no distress and morbidly obese Neck: no carotid bruit and no JVD Lungs: clear to auscultation bilaterally Heart: regular rate and rhythm, S1, S2 normal, no murmur, click, rub or gallop Extremities: extremities normal, atraumatic, no cyanosis or edema Pulses: 2+ and symmetric Skin: Skin color, texture, turgor normal. No rashes or lesions Neurologic: Grossly normal  EKG not performed   ASSESSMENT AND PLAN:   1. Chest Pain: normal cath 11/25/15 and normal LVEF. Non cardiac chest pain.   2. Palpitations: resolved after addition of metoprolol. 30 day monitor unrevealing. Normal Echo. HR and BP stable. Continue BB.   3. HTN: well controlled on current regimen.   4. DM: followed by PCP.   5. Obesity: pt acknowledges that she needs to loose weight.   PLAN  F/u in 3 months with Dr. Azzie Glatter PA-C 01/09/2016 10:58 AM

## 2016-04-10 ENCOUNTER — Encounter: Payer: Self-pay | Admitting: Cardiovascular Disease

## 2016-04-10 ENCOUNTER — Ambulatory Visit (INDEPENDENT_AMBULATORY_CARE_PROVIDER_SITE_OTHER): Payer: Medicare Other | Admitting: Cardiovascular Disease

## 2016-04-10 VITALS — BP 146/100 | HR 96 | Ht 63.0 in | Wt 277.8 lb

## 2016-04-10 DIAGNOSIS — I10 Essential (primary) hypertension: Secondary | ICD-10-CM

## 2016-04-10 NOTE — Progress Notes (Signed)
04/10/2016 Carol Pena   05-16-1945  [id number]  Primary Physician Hayden Rasmussen., MD Primary Cardiologist: Dr. Acie Fredrickson    Reason for Visit/CC:  [facility] F/u for CP and Palpitations.   HPI:  71 yo WF who recently underwent w/u for chest pain and palpitations. She was admitted to [facility] and ruled out for MI.  She had an abnormal myoview study. She underwent a LHC per Dr. Martinique on 11/25/15 which showed normal coronary anatomy and normal LVF and normal LV EDP. EF was 55-60%. Echocardiogram also confirmed normal LVEF at 55-60%. Grade 1 DD was noted. No significant valvular abnormalties. Given her symptoms of palpitations, she was ordered to wear a 30 day monitor. She was also started on a BB, metoprolol XL, 25 mg daily. Her monitor has not been reviewed by MD yet. I have personally reviewed results. No adverse cardiac arrhthymias noted. She notes her palpitations have resolved. She is tolerating metoprolol well w/o side effects. BP and HR are both stable. No CP or dyspnea. No post cath complications. Her radial cath site is stable.   Feb. 27, 2018:  Is doing well.  No further palpitations. Now thinks it was due to panic attack. Has had some Chest pain ,  Had a normal cath .  Echo :  Normal LV systolic function, grade 1 DD 30 day monitor is normal .   BP is high today . ( ate a country ham biscuit yesterday )  Does not exercise   Current Meds  Medication Sig  . allopurinol (ZYLOPRIM) 300 MG tablet   . aspirin EC 81 MG tablet Take 81 mg by mouth daily.  . B Complex-C (SUPER B COMPLEX PO) Take 1 tablet by mouth daily.  . Biotin 5000 MCG CAPS Take 5,000 mcg by mouth daily.  . Calcium-Magnesium 333-167 MG TABS Take 1 tablet by mouth daily.  . Cholecalciferol (VITAMIN D3) 1000 units CAPS Take 1,000 Units by mouth daily.  Marland Kitchen Dextromethorphan-Guaifenesin (MUCINEX DM) 30-600 MG TB12 Take 1 tablet by mouth 2 (two) times daily.  . folic acid (FOLVITE) Q000111Q MCG tablet Take 800 mcg by mouth  daily.  Marland Kitchen glimepiride (AMARYL) 4 MG tablet Take 6 mg by mouth daily with breakfast.   . losartan (COZAAR) 100 MG tablet Take 100 mg by mouth daily.  . metFORMIN (GLUCOPHAGE) 1000 MG tablet Take 500-1,000 mg by mouth 3 (three) times daily. 1000 mg twice daily and 500 mg at lunch  . metoprolol succinate (TOPROL-XL) 25 MG 24 hr tablet   . Turmeric 500 MG CAPS Take 500 mg by mouth daily.   Allergies  Allergen Reactions  . Flagyl [Metronidazole] Rash  . Penicillins Hives   Past Medical History:  Diagnosis Date  . Arthritis   . Cataract    left eye catract  . Cholelithiasis   . Diabetes mellitus   . Diverticula of colon   . Edema of both legs   . Gout   . Hypertension    Family History  Problem Relation Age of Onset  . Osteoporosis Mother   . Heart disease Mother   . Hypertension Mother   . Heart attack Mother    Past Surgical History:  Procedure Laterality Date  . APPENDECTOMY    . c-section x2    . CARDIAC CATHETERIZATION N/A 11/25/2015   Procedure: Left Heart Cath and Coronary Angiography;  Surgeon: Peter M Martinique, MD;  Location: [place] [facility];  Service: Cardiovascular;  Laterality: N/A;  . catract left eye    .  CESAREAN SECTION    . COLONOSCOPY  01/25/2011   Procedure: COLONOSCOPY;  Surgeon: Juanita Craver, MD;  Location: [facility];  Service: Endoscopy;  Laterality: N/A;  . EYE SURGERY    . PARATHYROIDECTOMY    . RETINAL LASER PROCEDURE    . TUBAL LIGATION     Social History   Social History  . Marital status: Married    Spouse name: N/A  . Number of children: N/A  . Years of education: N/A   Occupational History  . Retired    Social History Main Topics  . Smoking status: Never Smoker  . Smokeless tobacco: Never Used  . Alcohol use No  . Drug use: No  . Sexual activity: Yes    Partners: Male    Birth control/ protection: Surgical     Comment: btl   Other Topics Concern  . Not on file   Social History Narrative   She lives in [place] with her husband.     Review of Systems: General: negative for chills, fever, night sweats or weight changes.  Cardiovascular: negative for chest pain, dyspnea on exertion, edema, orthopnea, palpitations, paroxysmal nocturnal dyspnea or shortness of breath Dermatological: negative for rash Respiratory: negative for cough or wheezing Urologic: negative for hematuria Abdominal: negative for nausea, vomiting, diarrhea, bright red blood per rectum, melena, or hematemesis Neurologic: negative for visual changes, syncope, or dizziness All other systems reviewed and are otherwise negative except as noted above.   Physical Exam:  Blood pressure (!) 146/100, pulse 96, height 5\' 3"  (1.6 m), weight 277 lb 12.8 oz (126 kg), last menstrual period 02/12/2001, SpO2 99 %.   General appearance: alert, cooperative, no distress and morbidly obese Neck: no carotid bruit and no JVD Lungs: clear to auscultation bilaterally Heart: regular rate and rhythm, S1, S2 normal, no murmur, click, rub or gallop Extremities: extremities normal, atraumatic, no cyanosis or edema Pulses: 2+ and symmetric Skin: Skin color, texture, turgor normal. No rashes or lesions Neurologic: Grossly normal  EKG not performed   ASSESSMENT AND PLAN:   1. Chest Pain: normal cath 11/25/15 and normal LVEF. Non cardiac chest pain.   2. Palpitations: resolved after addition of metoprolol. 30 day monitor unrevealing. Normal Echo. HR and BP stable. Continue BB.   3. HTN: well controlled on current regimen.   4. DM: followed by PCP.   5. Obesity: pt acknowledges that she needs to loose weight.   She will follow up with her medical doctor . Will see her as needed.     Mertie Moores, MD  04/10/2016 9:26 AM    [address],  [address] [place], [place]  [address] Pager [phone] Phone: [phone]; Fax: [phone]

## 2016-04-10 NOTE — Patient Instructions (Signed)
Medication Instructions:  Your physician recommends that you continue on your current medications as directed. Please refer to the Current Medication list given to you today.   Labwork: None Ordered   Testing/Procedures: None Ordered   Follow-Up: Your physician recommends that you schedule a follow-up appointment in: as needed with Dr. Nahser   If you need a refill on your cardiac medications before your next appointment, please call your pharmacy.   Thank you for choosing [facility]! Elycia Woodside, RN [phone]    

## 2016-09-05 ENCOUNTER — Encounter: Payer: Self-pay | Admitting: Registered"

## 2016-09-05 ENCOUNTER — Encounter: Payer: Medicare Other | Attending: Family Medicine | Admitting: Registered"

## 2016-09-05 NOTE — Progress Notes (Signed)
Diabetes Self-Management Education  Visit Type: First/Initial  Appt. Start Time: 0900 Appt. End Time: 1030  09/10/2016  Carol Pena, identified by name and date of birth, is a 71 y.o. female with a diagnosis of Diabetes: Type 2.   ASSESSMENT Pt states when she was taking a statin it raised blood sugar to 215. Pt reports she stopped the medication on her own and now BG range is 150-186. Pt states MD started her on statin d/t TGs. Per referral TG 396, 264 HDL 38, 54  Pt states 6 yrs ago she started having issues with gout after parathyroid surgery  Pt states she has problems with bloating. RD will discuss sodium at next visit (bojanagles country ham biscuit). Pt states she loves bread and pasta.  Pt is not able to get much physical activity. Pt states her knees hurt after shopping at [place] states she uses the shopping cart like a walker. Pt states water aerobics appeals to her, but body image stops her. Pt was will to  look into silver sneakers.     Diabetes Self-Management Education - 09/05/16 0909      Visit Information   Visit Type First/Initial     Initial Visit   Diabetes Type Type 2   Are you currently following a meal plan? No   Are you taking your medications as prescribed? Yes   Date Diagnosed 2 yrs ago     Health Coping   How would you rate your overall health? Good     Psychosocial Assessment   Patient Belief/Attitude about Diabetes Afraid   How often do you need to have someone help you when you read instructions, pamphlets, or other written materials from your doctor or pharmacy? 1 - Never   What is the last grade level you completed in school? 1 yr college     Complications   Last HgB A1C per patient/outside source 7.3 %  07/2016 per pt   How often do you check your blood sugar? --  2-3 times/ week   Fasting Blood glucose range (mg/dL) --  186   Number of hypoglycemic episodes per month 0   Have you had a dilated eye exam in the past 12  months? Yes   Have you had a dental exam in the past 12 months? Yes   Are you checking your feet? No     Dietary Intake   Breakfast cheese toast whole wheat, milk occassional    Snack (morning) nuts OR cheeries OR PB   Lunch tuna OR burger, sometimes fries OR salads, OR zalad ([place]) OR bojanagles country ham biscuit   Snack (afternoon) none   Dinner chicken OR hamburger steak, sweet potato, green beans OR stew   Snack (evening) nuts OR cheeries OR apple w PB   Beverage(s) water, 1/2 c unsweet tea sometimes     Exercise   Exercise Type ADL's   How many days per week to you exercise? 0   How many minutes per day do you exercise? 0   Total minutes per week of exercise 0     Patient Education   Previous Diabetes Education No   Nutrition management  Role of diet in the treatment of diabetes and the relationship between the three main macronutrients and blood glucose level;Carbohydrate counting   Physical activity and exercise  Role of exercise on diabetes management, blood pressure control and cardiac health.   Psychosocial adjustment Role of stress on diabetes     Individualized Goals (  developed by patient)   Nutrition General guidelines for healthy choices and portions discussed   Physical Activity --  Call your Insurance company about [place] program     Outcomes   Expected Outcomes Demonstrated interest in learning. Expect positive outcomes   Future DMSE PRN   Program Status Not Completed    Individualized Plan for Diabetes Self-Management Training:   Learning Objective:  Patient will have a greater understanding of diabetes self-management. Patient education plan is to attend individual and/or group sessions per assessed needs and concerns.  Patient Instructions  Consider checking with doctor adding B vitamin back. Metform can deplete B12 over time so that may be advisable to continue.   Consider looking into Health At Every Size community for body image  support  Plan:  Aim for 2-3 Carb Choices per meal (30-45 grams) +/- 1 either way  Aim for 0-1 Carbs per snack if hungry  Include protein in moderation with your meals and snacks. Fairlife milk products are lower carb/higher protein Consider reading food labels for Total Carbohydrate Consider following through with looking into silver sneakers, and arm exercises   Expected Outcomes:  Demonstrated interest in learning. Expect positive outcomes  Education material provided: My Plate and Carbohydrate counting sheet  If problems or questions, patient to contact team via:  Phone and MyChart  Future DSME appointment: PRN

## 2016-09-05 NOTE — Patient Instructions (Addendum)
Consider checking with doctor adding B vitamin back. Metform can deplete B12 over time so that may be advisable to continue.   Consider looking into Health At Every Size community for body image support  Plan:  Aim for 2-3 Carb Choices per meal (30-45 grams) +/- 1 either way  Aim for 0-1 Carbs per snack if hungry  Include protein in moderation with your meals and snacks. Fairlife milk products are lower carb/higher protein Consider reading food labels for Total Carbohydrate Consider following through with looking into silver sneakers, and arm exercises

## 2016-10-03 ENCOUNTER — Ambulatory Visit: Payer: Medicare Other | Admitting: Registered"

## 2017-06-21 ENCOUNTER — Other Ambulatory Visit: Payer: Self-pay | Admitting: Family Medicine

## 2017-06-21 ENCOUNTER — Other Ambulatory Visit (HOSPITAL_COMMUNITY): Payer: Self-pay | Admitting: Family Medicine

## 2017-06-21 DIAGNOSIS — R1031 Right lower quadrant pain: Secondary | ICD-10-CM

## 2017-09-10 ENCOUNTER — Ambulatory Visit: Payer: Medicare Other | Admitting: Allergy

## 2021-11-24 ENCOUNTER — Emergency Department (HOSPITAL_COMMUNITY)
Admission: EM | Admit: 2021-11-24 | Discharge: 2021-11-24 | Disposition: A | Discharge status: Home / Self Care | Payer: Medicare Other | Attending: Emergency Medicine | Admitting: Emergency Medicine

## 2021-11-24 ENCOUNTER — Encounter (HOSPITAL_COMMUNITY): Payer: Self-pay | Admitting: Emergency Medicine

## 2021-11-24 ENCOUNTER — Emergency Department (HOSPITAL_COMMUNITY): Payer: Medicare Other

## 2021-11-24 ENCOUNTER — Other Ambulatory Visit: Payer: Self-pay

## 2021-11-24 DIAGNOSIS — Z79899 Other long term (current) drug therapy: Secondary | ICD-10-CM | POA: Diagnosis not present

## 2021-11-24 DIAGNOSIS — S0993XA Unspecified injury of face, initial encounter: Secondary | ICD-10-CM | POA: Diagnosis present

## 2021-11-24 DIAGNOSIS — I159 Secondary hypertension, unspecified: Secondary | ICD-10-CM

## 2021-11-24 DIAGNOSIS — I10 Essential (primary) hypertension: Secondary | ICD-10-CM | POA: Diagnosis not present

## 2021-11-24 DIAGNOSIS — Z794 Long term (current) use of insulin: Secondary | ICD-10-CM | POA: Diagnosis not present

## 2021-11-24 DIAGNOSIS — W01198A Fall on same level from slipping, tripping and stumbling with subsequent striking against other object, initial encounter: Secondary | ICD-10-CM | POA: Insufficient documentation

## 2021-11-24 DIAGNOSIS — M25561 Pain in right knee: Secondary | ICD-10-CM | POA: Insufficient documentation

## 2021-11-24 DIAGNOSIS — W19XXXA Unspecified fall, initial encounter: Secondary | ICD-10-CM

## 2021-11-24 DIAGNOSIS — S00511A Abrasion of lip, initial encounter: Secondary | ICD-10-CM | POA: Insufficient documentation

## 2021-11-24 MED ORDER — ACETAMINOPHEN 500 MG PO TABS
1000.0000 mg | ORAL_TABLET | Freq: Once | ORAL | Status: AC
  Administered 2021-11-24: 1000 mg via ORAL
  Filled 2021-11-24: qty 2

## 2021-11-24 MED ORDER — HYDRALAZINE HCL 25 MG PO TABS
50.0000 mg | ORAL_TABLET | Freq: Once | ORAL | Status: AC
  Administered 2021-11-24: 50 mg via ORAL
  Filled 2021-11-24: qty 2

## 2021-11-24 NOTE — ED Provider Notes (Signed)
Patient cleared from injury, incidental hypertension with diastolic 567O/141 by EMS. F/U repeat BPs with desired goal systolic less than 030. Patient asymptomatic. D/C instructions ready. Physical Exam  BP (!) 151/136 (BP Location: Right Arm)   Pulse 98   Temp 98.5 F (36.9 C) (Oral)   Resp 14   LMP 02/12/2001   SpO2 99%   Physical Exam  Procedures  Procedures  ED Course / MDM    Medical Decision Making Amount and/or Complexity of Data Reviewed Radiology: ordered.  Risk OTC drugs. Prescription drug management.          Charlesetta Shanks, MD 11/24/21 1537

## 2021-11-24 NOTE — ED Notes (Signed)
Pt states she does not want a CT scan

## 2021-11-24 NOTE — ED Provider Notes (Signed)
[facility] EMERGENCY DEPARTMENT Provider Note   CSN: [id number] Arrival date & time: 11/24/21  1229     History {Add pertinent medical, surgical, social history, OB history to HPI:1} Chief Complaint  Patient presents with   Carol Pena is a 76 y.o. female presented to ED with a mechanical fall onto her right knee.  The patient reports that she lost her balance while emptying a garbage bin today, and fell forward onto her right knee and also striking the right side of her face on the ground.  She does not use blood thinners.  She has not tried to ambulate since then, EMS was called and picked her up and brought her into the hospital, primarily because they were concerned that her blood pressure was high.  Blood pressure was 220/120 per EMS.  The patient reports that she only takes losartan for her hypertension, is faithful taking it, and takes it only at night.  She says typically her blood pressures around 140/80.  She denies headache, lightheadedness, shortness of breath, chest pain.  Her pain in her right knee is mild to moderate.  She reports she does have arthritis in that knee and was told that she would need "a knee replacement".  HPI     Home Medications Prior to Admission medications   Medication Sig Start Date End Date Taking? Authorizing Provider  allopurinol (ZYLOPRIM) 300 MG tablet  12/21/15   [provider]  aspirin EC 81 MG tablet Take 81 mg by mouth daily.    [provider]  B Complex-C (SUPER B COMPLEX PO) Take 1 tablet by mouth daily.    [provider]  Biotin 5000 MCG CAPS Take 5,000 mcg by mouth daily.    [provider]  Calcium-Magnesium 333-167 MG TABS Take 1 tablet by mouth daily.    [provider]  carvedilol (COREG) 3.125 MG tablet Take 3.125 mg by mouth 2 (two) times daily with a meal.    [provider]  Cholecalciferol (VITAMIN D3) 1000 units CAPS Take 1,000 Units by  mouth daily.    [provider]  Dextromethorphan-Guaifenesin (MUCINEX DM) 30-600 MG TB12 Take 1 tablet by mouth 2 (two) times daily. Patient not taking: Reported on 09/05/2016 03/27/15   Wardell Honour, MD  folic acid (FOLVITE) 578 MCG tablet Take 800 mcg by mouth daily.    [provider]  glimepiride (AMARYL) 4 MG tablet Take 6 mg by mouth daily with breakfast.     [provider]  losartan (COZAAR) 100 MG tablet Take 100 mg by mouth daily.    [provider]  metFORMIN (GLUCOPHAGE) 1000 MG tablet Take 500-1,000 mg by mouth 3 (three) times daily. 1000 mg twice daily and 500 mg at lunch    [provider]  metoprolol succinate (TOPROL-XL) 25 MG 24 hr tablet  12/20/15   [provider]  Turmeric 500 MG CAPS Take 500 mg by mouth daily.    [provider]      Allergies    Flagyl [metronidazole] and Penicillins    Review of Systems   Review of Systems  Physical Exam Updated Vital Signs LMP 02/12/2001  Physical Exam Constitutional:      General: She is not in acute distress. HENT:     Head: Normocephalic.     Comments: Patient has a small abrasion to the right upper lip, she has no missing or broken teeth, no loose teeth  Eyes:  Conjunctiva/sclera: Conjunctivae normal.     Pupils: Pupils are equal, round, and reactive to light.  Cardiovascular:     Rate and Rhythm: Normal rate and regular rhythm.  Pulmonary:     Effort: Pulmonary effort is normal. No respiratory distress.  Abdominal:     General: There is no distension.     Tenderness: There is no abdominal tenderness.  Musculoskeletal:     Comments: Patient is able to fully flex and extend the right lower extremity, including at the knee. She has a moderate peripatellar effusion and some isolated tenderness of the patellar head, without visible deformity  Skin:    General: Skin is warm and dry.  Neurological:     General: No focal deficit present.     Mental  Status: She is alert. Mental status is at baseline.  Psychiatric:        Mood and Affect: Mood normal.        Behavior: Behavior normal.     ED Results / Procedures / Treatments   Labs (all labs ordered are listed, but only abnormal results are displayed) Labs Reviewed - No data to display  EKG None  Radiology No results found.  Procedures Procedures  {Document cardiac monitor, telemetry assessment procedure when appropriate:1}  Medications Ordered in ED Medications - No data to display  ED Course/ Medical Decision Making/ A&P                           Medical Decision Making Amount and/or Complexity of Data Reviewed Radiology: ordered.   Patient is here with isolated injury to the right knee after a mechanical fall from standing today.  She has a small abrasion as well to her right lip, which is not requiring laceration repair.  She does not have any loose or broken teeth.  I have a very low suspicion intracranial injury.  She has no headache, no neck tenderness or pain.  Do not believe any CT imaging of the head or C-spine at this time.  X-rays of the knee were ordered and personally viewed interpreted, showing ***  Of note, the patient was noted to have hypertension in the field with EMS.  This very likely related to pain.  Her blood pressure here is ***  She is otherwise not having any signs or symptoms of hypertensive emergency.  {Document critical care time when appropriate:1} {Document review of labs and clinical decision tools ie heart score, Chads2Vasc2 etc:1}  {Document your independent review of radiology images, and any outside records:1} {Document your discussion with family members, caretakers, and with consultants:1} {Document social determinants of health affecting pt's care:1} {Document your decision making why or why not admission, treatments were needed:1} Final Clinical Impression(s) / ED Diagnoses Final diagnoses:  None    Rx / DC Orders ED  Discharge Orders     None

## 2021-11-24 NOTE — ED Triage Notes (Signed)
Pt BIB [facility] from home due to falling in the driveway.  Pt was rolling garbage can out and she fell into the trash can hitting her right knee and her right lip.  Pt has visible blood on right side of lip.  BP on scene was 220/118.  Hx of diabetes type 2

## 2021-11-24 NOTE — ED Notes (Signed)
EMT-P Carol Pena was not allowed access to pull the medication for hallway 15. When I can find a RN I will get the medication.

## 2021-11-24 NOTE — Discharge Instructions (Addendum)
I recommend that you use a walker or a cane at all times for stability with your right knee injury.  We did not see any fracture or broken bone on your CT scan.  However it still possible you have bad arthritis in the knee, swelling in that knee, and and therefore the knee may be wobbly when you are getting up or bearing weight on it. Take your time when getting up.  You can apply ice or cooling packs to the knee.  Your blood pressure was also quite high in the emergency room today.  This can be a stress response.  However it is important that you keep a close eye on this.  Please contact your doctors office to arrange for a follow-up visit early next week.  Please keep track of your blood pressures at home with a daily diary, checking in the morning and at night.  If your pressure stays high, you can call your doctor's office to make recommendations or prescribed medicines.

## 2021-12-18 ENCOUNTER — Ambulatory Visit: Payer: Self-pay

## 2021-12-18 NOTE — Telephone Encounter (Signed)
  Chief Complaint: lip injury Symptoms: R upper lip pain and swelling small knot that is sore and painful to touch  Frequency: 3 weeks ago  Pertinent Negatives: pt denies bleeding to area Disposition: '[]'$ ED /'[]'$ Urgent Care (no appt availability in office) / '[]'$ Appointment(In office/virtual)/ '[]'$  [facility]/ '[]'$ Home Care/ '[]'$ Refused Recommended Disposition /'[]'$ [facility]/ '[x]'$  Follow-up with PCP Additional Notes: pt went to ED for fall 3 weeks ago and states ED never assessed lip or cleaned area and not no better than what it was 3 weeks ago. Pt unsure who needs to look at this but feels possible foreign body present at the area. I advised pt to contact PCP and get an appt and if unsuccessful to CB and we could advise her next step. Pt verbalized understanding.   Reason for Disposition  [1] After 72 hours AND [2] pain not improving  Answer Assessment - Initial Assessment Questions 1. MECHANISM: "How did the injury happen?"      Fall and hit lip as well as other body parts 2. ONSET: "When did the injury happen?" (Minutes or hours ago)      3 weeks ago  3. LOCATION: "What part of the mouth is injured?"      R upper lip. 4. APPEARANCE of INJURY: "What does the mouth look like?"      Small knot that she feels is foreign body, small white area comes up similar to white head  5. SEVERITY: "How swollen is it?"     Slightly  6. PAIN: "Is the swelling painful to touch?" If Yes, ask: "How painful is it?"   (Scale 1-10; mild, moderate or severe)     2-3, 7 if touched  7. OTHER SYMPTOMS: "Do you have any other symptoms?" (e.g., toothache)     no  Protocols used: Mouth Injury-A-AH

## 2022-05-01 ENCOUNTER — Ambulatory Visit: Payer: Medicare Other | Admitting: Plastic Surgery

## 2022-05-01 ENCOUNTER — Encounter: Payer: Self-pay | Admitting: Plastic Surgery

## 2022-05-01 VITALS — BP 167/112 | HR 95 | Ht 62.0 in | Wt 230.0 lb

## 2022-05-01 DIAGNOSIS — S01511A Laceration without foreign body of lip, initial encounter: Secondary | ICD-10-CM | POA: Insufficient documentation

## 2022-05-01 DIAGNOSIS — W19XXXA Unspecified fall, initial encounter: Secondary | ICD-10-CM

## 2022-05-01 NOTE — Progress Notes (Signed)
Patient ID: Carol Pena, female    DOB: 23-Jul-1945, 77 y.o.   MRN: [id number]   Chief Complaint  Patient presents with   consult    The patient is a 77 year old female here for evaluation of her upper lip.  In October she fell and sustained a laceration.  She had elevated blood pressure.  This became more serious and the lip laceration was put to the side.  When she got home she realized that nothing had been done.  However it healed on its own.  She had some fullness of it and that is why she made the appointment.  The area has improved markedly over the last 4 months.  Today she is actually quite happy with the result.  It does not look like she has a foreign body and it looks like it is healed very nicely.      Review of Systems  Constitutional: Negative.   HENT: Negative.    Eyes: Negative.   Respiratory: Negative.    Cardiovascular: Negative.   Gastrointestinal: Negative.   Endocrine: Negative.   Genitourinary: Negative.     Past Medical History:  Diagnosis Date   Arthritis    Cataract    left eye catract   Cholelithiasis    Diabetes mellitus    Diverticula of colon    Edema of both legs    Gout    Hypertension     Past Surgical History:  Procedure Laterality Date   APPENDECTOMY     c-section x2     CARDIAC CATHETERIZATION N/A 11/25/2015   Procedure: Left Heart Cath and Coronary Angiography;  Surgeon: Peter M Martinique, MD;  Location: [facility];  Service: Cardiovascular;  Laterality: N/A;   catract left eye     CESAREAN SECTION     COLONOSCOPY  01/25/2011   Procedure: COLONOSCOPY;  Surgeon: Juanita Craver, MD;  Location: [facility];  Service: Endoscopy;  Laterality: N/A;   EYE SURGERY     PARATHYROIDECTOMY     RETINAL LASER PROCEDURE     TUBAL LIGATION        Current Outpatient Medications:    allopurinol (ZYLOPRIM) 300 MG tablet, Take 300 mg by mouth daily., Disp: , Rfl:    Biotin 1000 MCG tablet, Take 1,000 mcg by mouth daily., Disp: , Rfl:     gabapentin (NEURONTIN) 100 MG capsule, Take 200 mg by mouth at bedtime., Disp: , Rfl:    glipiZIDE (GLUCOTROL XL) 10 MG 24 hr tablet, Take 10 mg by mouth daily with breakfast., Disp: , Rfl:    losartan (COZAAR) 100 MG tablet, Take 25 mg by mouth daily., Disp: , Rfl:    metformin (FORTAMET) 1000 MG (OSM) 24 hr tablet, Take 1,000 mg by mouth daily., Disp: , Rfl:    simvastatin (ZOCOR) 10 MG tablet, Take 10 mg by mouth at bedtime., Disp: , Rfl:    ibuprofen (ADVIL) 400 MG tablet, Take 400 mg by mouth every 6 (six) hours as needed for headache, mild pain or moderate pain. (Patient not taking: Reported on 05/01/2022), Disp: , Rfl:    TRULICITY [id number] SOPN, Inject 0.75 mg into the skin once a week. Sundays, Disp: , Rfl:    TRULICITY 3 0000000 SOPN, Inject 3 mg into the skin once a week. Sundays, Disp: , Rfl:    Objective:   Vitals:   05/01/22 1512 05/01/22 1514  BP: (!) 167/142 (!) 167/112  Pulse: 95   SpO2: 98%  Physical Exam Vitals and nursing note reviewed.  Constitutional:      Appearance: Normal appearance.  Cardiovascular:     Rate and Rhythm: Normal rate.     Pulses: Normal pulses.  Pulmonary:     Effort: Pulmonary effort is normal.  Musculoskeletal:        General: No swelling or deformity.  Skin:    General: Skin is warm.     Capillary Refill: Capillary refill takes less than 2 seconds.  Neurological:     Mental Status: She is alert and oriented to person, place, and time.  Psychiatric:        Mood and Affect: Mood normal.        Behavior: Behavior normal.        Thought Content: Thought content normal.        Judgment: Judgment normal.     Assessment & Plan:  Lip laceration, initial encounter  Recommend giving it more time.  The patient is happy with this suggestion and I also recommend massaging it and using a little bit of Vaseline.  Call us with any questions or concerns.  Pictures were obtained of the patient and placed in the chart with the  patient's or guardian's permission.   [place], DO
